# Patient Record
Sex: Female | Born: 1985 | Race: White | Hispanic: No | Smoking: Current every day smoker
Health system: Southern US, Community
[De-identification: ages and names within clinical notes are randomized; demographics above are authoritative.]

## PROBLEM LIST (undated history)

## (undated) DIAGNOSIS — N390 Urinary tract infection, site not specified: Secondary | ICD-10-CM

## (undated) DIAGNOSIS — B1920 Unspecified viral hepatitis C without hepatic coma: Secondary | ICD-10-CM

## (undated) HISTORY — DX: Unspecified viral hepatitis C without hepatic coma: B19.20

## (undated) HISTORY — PX: TUBAL LIGATION: SHX77

---

## 2007-02-20 ENCOUNTER — Inpatient Hospital Stay (HOSPITAL_COMMUNITY): Admission: RE | Admit: 2007-02-20 | Discharge: 2007-02-22 | Payer: Self-pay | Admitting: Obstetrics and Gynecology

## 2008-02-23 ENCOUNTER — Emergency Department (HOSPITAL_COMMUNITY): Admission: EM | Admit: 2008-02-23 | Discharge: 2008-02-23 | Payer: Self-pay | Admitting: Emergency Medicine

## 2008-06-02 ENCOUNTER — Emergency Department (HOSPITAL_COMMUNITY): Admission: EM | Admit: 2008-06-02 | Discharge: 2008-06-02 | Payer: Self-pay | Admitting: Emergency Medicine

## 2008-07-04 ENCOUNTER — Ambulatory Visit (HOSPITAL_COMMUNITY): Admission: RE | Admit: 2008-07-04 | Discharge: 2008-07-04 | Payer: Self-pay | Admitting: Obstetrics & Gynecology

## 2008-08-22 ENCOUNTER — Ambulatory Visit (HOSPITAL_COMMUNITY): Admission: RE | Admit: 2008-08-22 | Discharge: 2008-08-22 | Payer: Self-pay | Admitting: Obstetrics & Gynecology

## 2008-09-08 ENCOUNTER — Emergency Department (HOSPITAL_COMMUNITY): Admission: EM | Admit: 2008-09-08 | Discharge: 2008-09-08 | Payer: Self-pay | Admitting: Emergency Medicine

## 2008-09-09 ENCOUNTER — Other Ambulatory Visit: Admission: RE | Admit: 2008-09-09 | Discharge: 2008-09-09 | Payer: Self-pay | Admitting: Obstetrics and Gynecology

## 2008-09-25 ENCOUNTER — Ambulatory Visit (HOSPITAL_COMMUNITY): Admission: RE | Admit: 2008-09-25 | Discharge: 2008-09-25 | Payer: Self-pay | Admitting: Obstetrics & Gynecology

## 2008-11-28 ENCOUNTER — Inpatient Hospital Stay (HOSPITAL_COMMUNITY): Admission: RE | Admit: 2008-11-28 | Discharge: 2008-11-30 | Payer: Self-pay | Admitting: Obstetrics & Gynecology

## 2008-11-28 ENCOUNTER — Encounter: Payer: Self-pay | Admitting: Obstetrics & Gynecology

## 2008-12-07 ENCOUNTER — Emergency Department (HOSPITAL_COMMUNITY): Admission: EM | Admit: 2008-12-07 | Discharge: 2008-12-07 | Payer: Self-pay | Admitting: Emergency Medicine

## 2009-10-16 ENCOUNTER — Emergency Department (HOSPITAL_COMMUNITY): Admission: EM | Admit: 2009-10-16 | Discharge: 2009-10-16 | Payer: Self-pay | Admitting: Emergency Medicine

## 2009-11-27 ENCOUNTER — Emergency Department (HOSPITAL_COMMUNITY): Admission: EM | Admit: 2009-11-27 | Discharge: 2009-11-27 | Payer: Self-pay | Admitting: Emergency Medicine

## 2010-05-20 IMAGING — US US OB FOLLOW-UP
1 series · 18 of 28 positions shown · non-contrast
Comparison: none

OBSTETRICAL ULTRASOUND:
 This ultrasound was performed in The [HOSPITAL], and the [facility] report will be stored to [REDACTED] PACS.

[Series 1: us ob follow-up · 59 acquisitions, 18 frames shown]
[im 1/59]
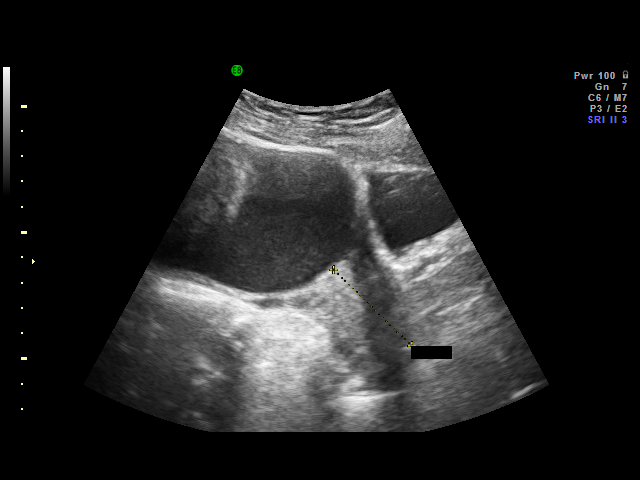
[im 5/59]
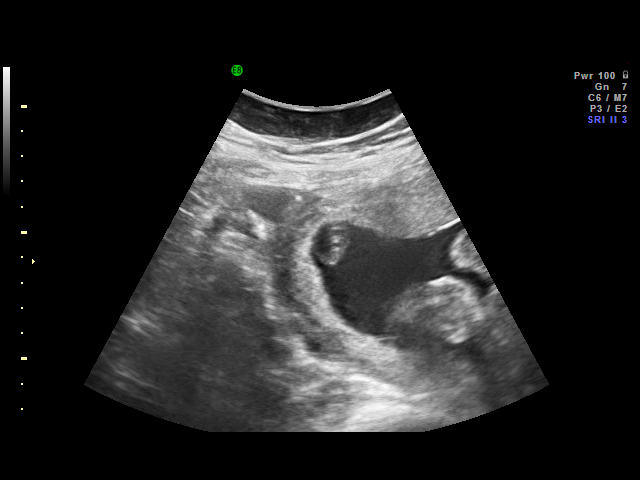
[im 7/59]
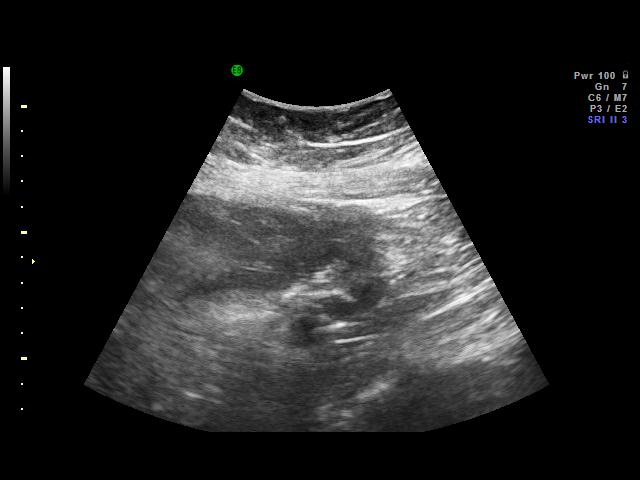
[im 11/59]
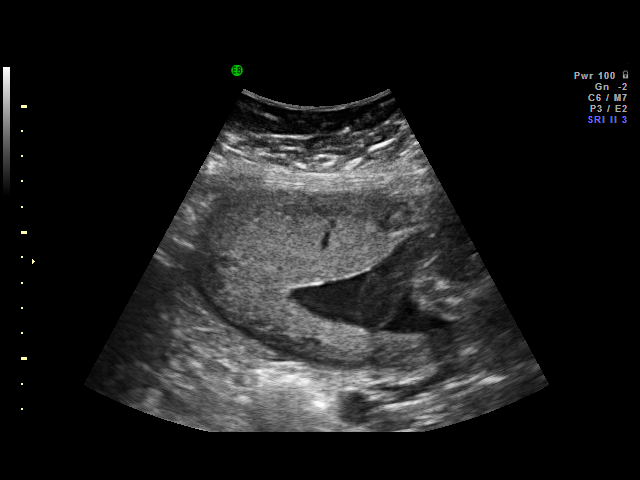
[im 16/59]
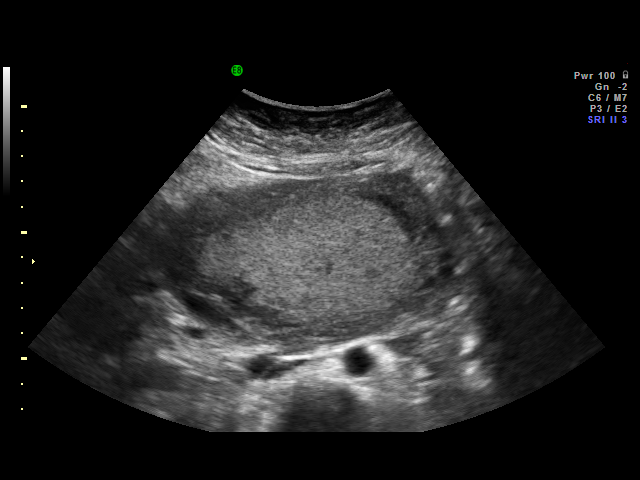
[im 18/59]
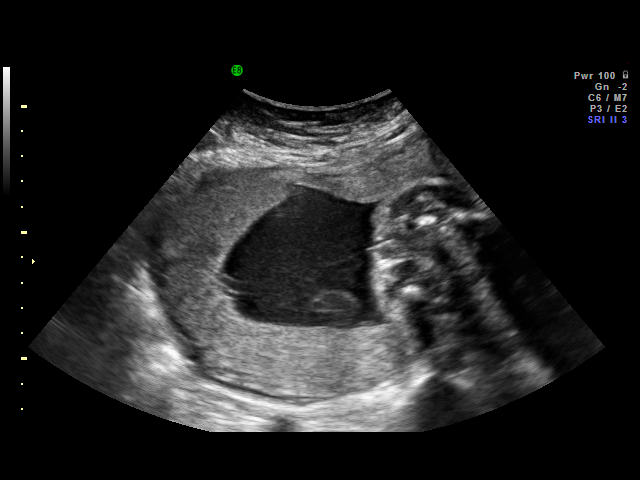
[im 22/59]
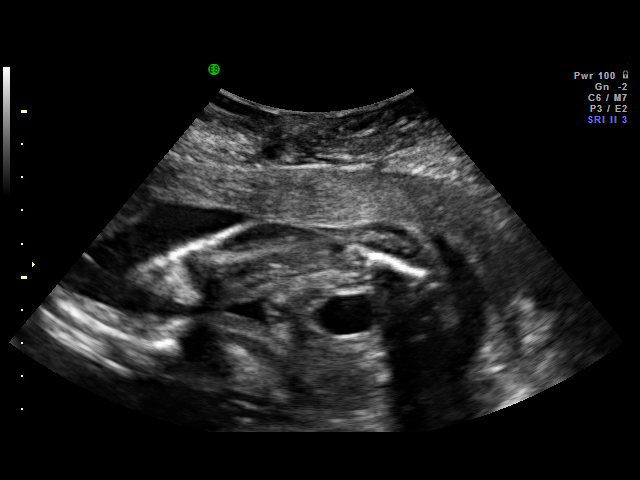
[im 24/59]
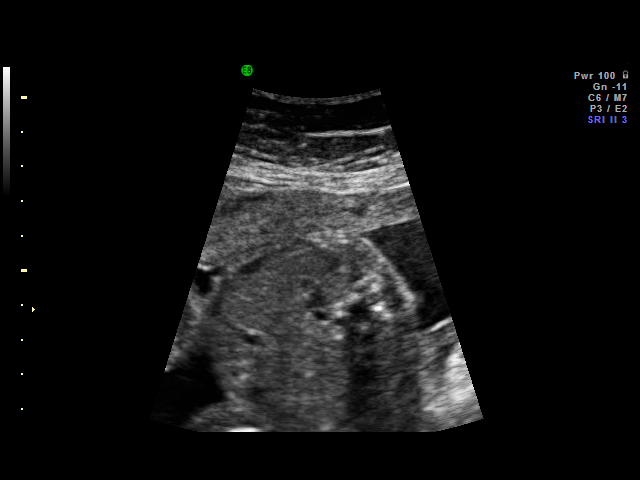
[im 28/59]
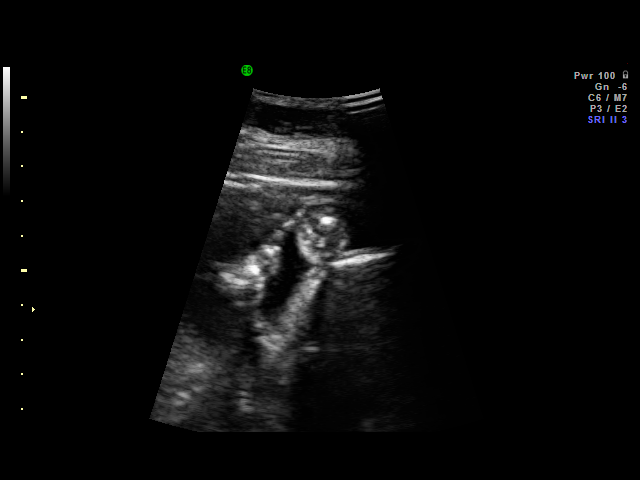
[im 31/59]
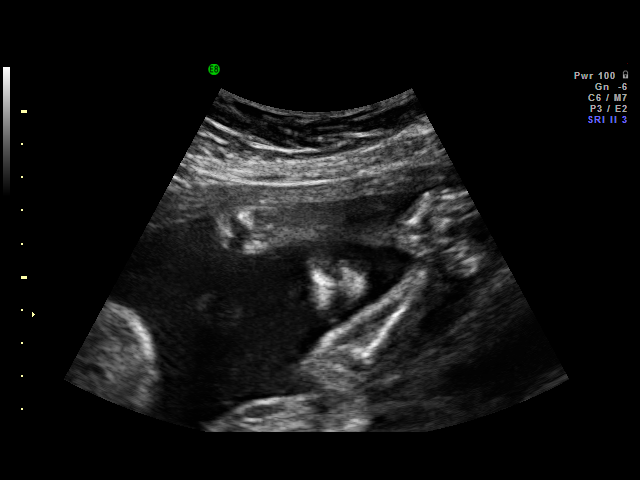
[im 35/59]
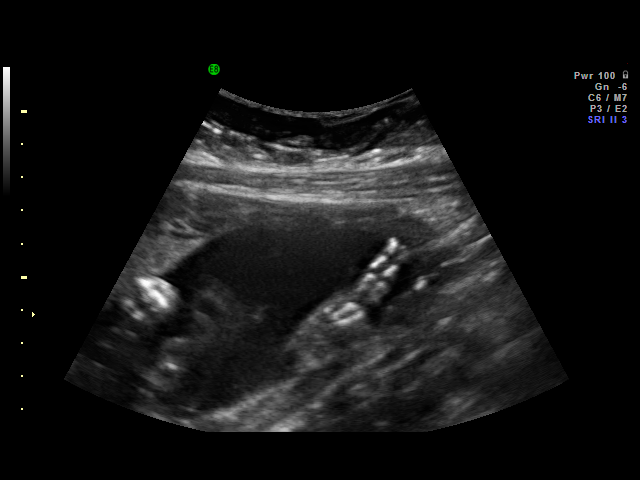
[im 37/59]
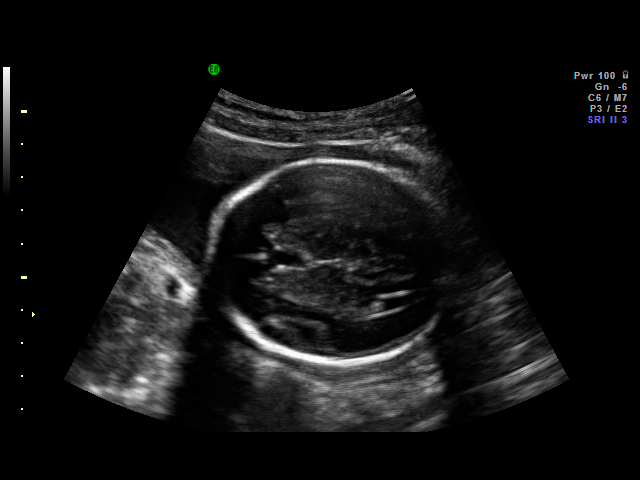
[im 41/59]
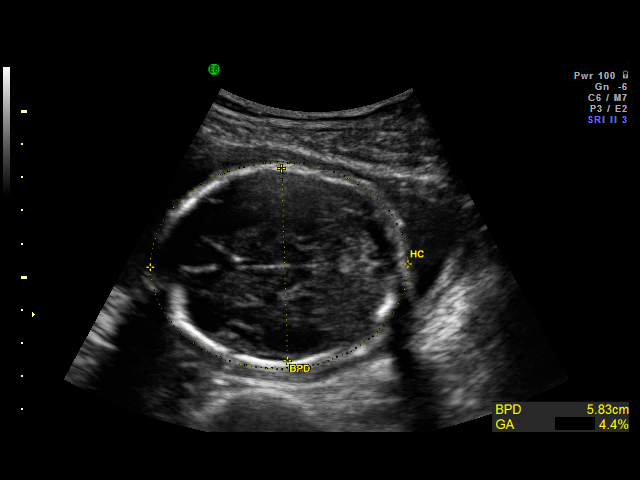
[im 46/59]
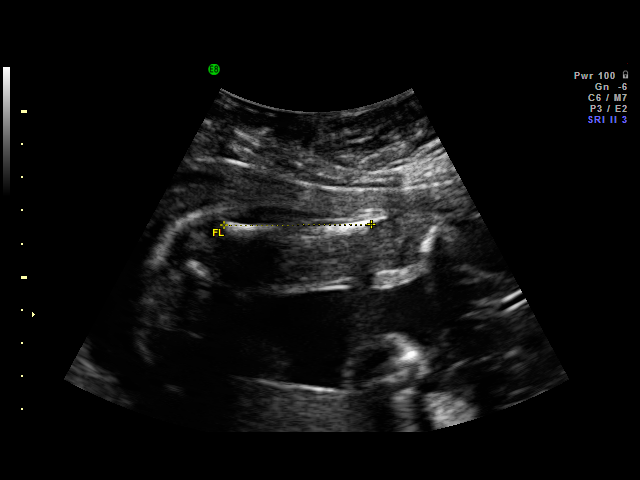
[im 48/59]
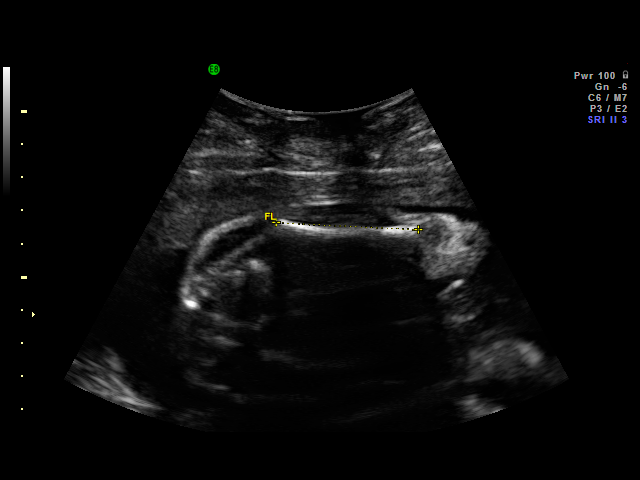
[im 52/59]
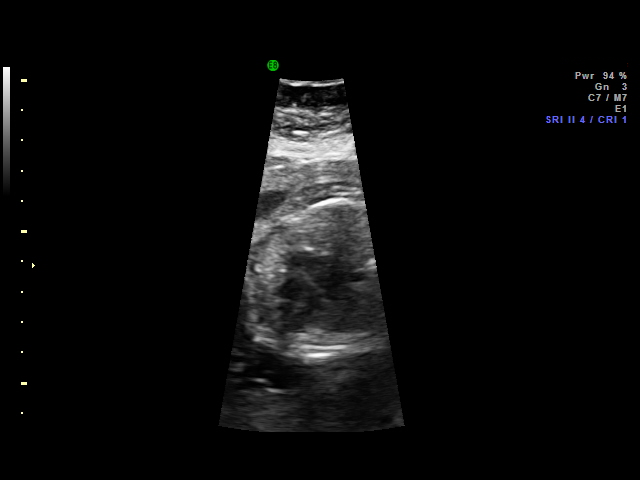
[im 54/59]
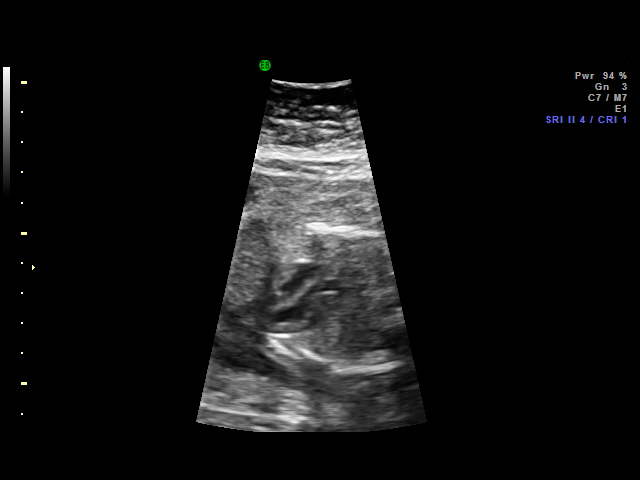
[im 59/59]
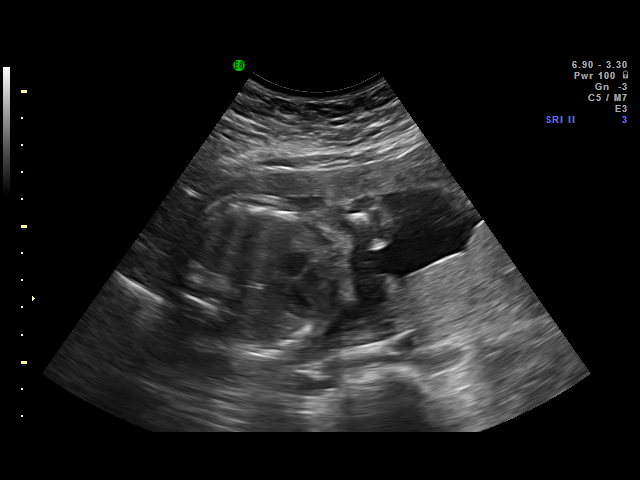

[18 of 28 positions shown; findings below may reference images not displayed]

IMPRESSION: [facility] has also been faxed to the ordering physician.

## 2010-07-28 ENCOUNTER — Emergency Department (HOSPITAL_COMMUNITY)
Admission: EM | Admit: 2010-07-28 | Discharge: 2010-07-28 | Payer: Self-pay | Source: Home / Self Care | Admitting: Emergency Medicine

## 2010-08-23 ENCOUNTER — Encounter: Payer: Self-pay | Admitting: Obstetrics & Gynecology

## 2010-10-20 LAB — URINE MICROSCOPIC-ADD ON

## 2010-10-20 LAB — URINALYSIS, ROUTINE W REFLEX MICROSCOPIC
Glucose, UA: NEGATIVE mg/dL
Ketones, ur: NEGATIVE mg/dL
Nitrite: POSITIVE — AB
Protein, ur: 30 mg/dL — AB
Specific Gravity, Urine: 1.015 (ref 1.005–1.030)
Urobilinogen, UA: 1 mg/dL (ref 0.0–1.0)
pH: 6 (ref 5.0–8.0)

## 2010-10-20 LAB — URINE CULTURE

## 2010-10-25 LAB — WET PREP, GENITAL: Yeast Wet Prep HPF POC: NONE SEEN

## 2010-10-25 LAB — URINALYSIS, ROUTINE W REFLEX MICROSCOPIC
Glucose, UA: NEGATIVE mg/dL
Ketones, ur: NEGATIVE mg/dL
Protein, ur: NEGATIVE mg/dL
Urobilinogen, UA: 0.2 mg/dL (ref 0.0–1.0)

## 2010-10-25 LAB — PREGNANCY, URINE: Preg Test, Ur: NEGATIVE

## 2010-11-10 LAB — PREGNANCY, URINE: Preg Test, Ur: POSITIVE

## 2010-11-10 LAB — URINALYSIS, ROUTINE W REFLEX MICROSCOPIC
Ketones, ur: NEGATIVE mg/dL
Nitrite: NEGATIVE
Protein, ur: NEGATIVE mg/dL
pH: 6.5 (ref 5.0–8.0)

## 2010-11-11 LAB — CBC
Hemoglobin: 11.1 g/dL — ABNORMAL LOW (ref 12.0–15.0)
MCHC: 33.6 g/dL (ref 30.0–36.0)
Platelets: 215 10*3/uL (ref 150–400)
RDW: 13.9 % (ref 11.5–15.5)
RDW: 13.9 % (ref 11.5–15.5)
WBC: 9.1 10*3/uL (ref 4.0–10.5)

## 2010-11-11 LAB — TYPE AND SCREEN
ABO/RH(D): B POS
Antibody Screen: NEGATIVE

## 2010-11-17 LAB — URINALYSIS, ROUTINE W REFLEX MICROSCOPIC
Nitrite: NEGATIVE
Protein, ur: NEGATIVE mg/dL
Urobilinogen, UA: 0.2 mg/dL (ref 0.0–1.0)

## 2010-11-17 LAB — POCT I-STAT, CHEM 8
HCT: 32 % — ABNORMAL LOW (ref 36.0–46.0)
Hemoglobin: 10.9 g/dL — ABNORMAL LOW (ref 12.0–15.0)
Sodium: 138 mEq/L (ref 135–145)
TCO2: 21 mmol/L (ref 0–100)

## 2010-12-15 NOTE — Op Note (Signed)
Diana [name], Diana [name]             ACCOUNT NO.:  [id number]   MEDICAL RECORD NO.:  [id number]          PATIENT TYPE:  INP   LOCATION:  9110                          FACILITY:  [facility]   PHYSICIAN:  Lazaro Arms, M.D.   DATE OF BIRTH:  07-31-1986   DATE OF PROCEDURE:  11/28/2008  DATE OF DISCHARGE:                               OPERATIVE REPORT   PREOPERATIVE DIAGNOSES:  1. Intrauterine pregnancy at 36 plus weeks' gestation.  2. Previous cesarean section x2.  3. Desires sterilization.   POSTOPERATIVE DIAGNOSES:  1. Intrauterine pregnancy at 54 plus weeks' gestation.  2. Previous cesarean section x2.  3. Desires sterilization.   PROCEDURE:  Repeat cesarean section and tubal ligation.   SURGEON:  Lazaro Arms, MD   ASSISTANT:  Dr. Noel Gerold.   ANESTHESIA:  Spinal.   FINDINGS:  Over low transverse hysterotomy incision, was delivered a  viable female at 79 with Apgars of 8 and 8, weight 6 pounds 12 ounces,  cord pH of 7.29.  Uterus, tubes, and ovaries were all normal.   DESCRIPTION OF OPERATION:  The patient was taken to the operating room,  placed in sitting position where she underwent a spinal anesthetic,  placed in supine position, rolled to her left, prepped and draped in  usual sterile fashion.  A Pfannenstiel skin incision was made and  carried down sharply to the rectus fascia, scored in the midline and  extended laterally.  The fascia was taken off the muscle superiorly and  inferiorly without difficulty.  The muscles were divided and the  peritoneal cavity was entered.  A bladder blade was placed.  The  vesicouterine serosal flap was created.  Low transverse hysterotomy  incision was made over this incision and delivered a viable female at  [phone] with Apgars of 8 and 8, weighing 6 pounds 12 ounces, three-vessel  cord, cord blood and cord gases were sent.  Cord gas 7.29.  Placenta was  delivered spontaneously.  The uterus was closed in single layer,  interlocking layer.   Modified Pomeroy bilateral tubal ligation was  performed.  A 2.5-cm segment bilaterally was removed and sent to  pathology.  There was good hemostasis.  The uterus was placed at  peritoneal cavity.  The pelvis was irrigated.  All pedicles found to be  hemostatic.  The peritoneum was reapproximated loosely.  The fascia  closed using 0 Vicryl  running.  Subcutaneous tissues were made hemostatic and irrigated.  The  skin was closed using skin staples.  The patient tolerated the procedure  well.  She experienced 500 mL of blood loss and taken to the recovery  room in good and stable condition.  All counts correct x3.      Lazaro Arms, M.D.  Electronically Signed     LHE/MEDQ  D:  11/28/2008  T:  11/29/2008  Job:  [id number]

## 2010-12-15 NOTE — Discharge Summary (Signed)
Diana, [name]             ACCOUNT NO.:  [id number]   MEDICAL RECORD NO.:  [id number]          PATIENT TYPE:  INP   LOCATION:  9130                          [facility]ACILITY:  [facility]   PHYSICIAN:  Tilda Burrow, M.D. DATE OF BIRTH:  July 13, 1986   DATE OF ADMISSION:  02/20/2007  DATE OF DISCHARGE:  07/23/2008LH                               DISCHARGE SUMMARY   ADMITTING DIAGNOSIS:  Pregnancy, 39 weeks, repeat cesarean section, not  for trial of labor.   DISCHARGE DIAGNOSES:  1. Pregnancy, 39 weeks, delivered, repeat cesarean section.  2. Uterine adhesions to abdominal wall.   PROCEDURE:  Repeat low transverse cervical cesarean section, lysis of  uterine adhesions to abdominal wall, Jannifer Franklin, February 20, 2007.   DISCHARGE MEDICATIONS:  1. Vicodin 5/500 #30 tablets one to two q. 4 hours p.r.n. pain.  2. Motrin 800 mg #30 tablets one p.o. q. 8 hours p.r.n. cramping.  3. Prenatal vitamin one daily.   FOLLOWUP:  Five to seven days for staple removal.   HOSPITAL SUMMARY:  This 25 year old gravida 2, para 1 was admitted for  repeat cesarean section.  See HPI, for details.  Prenatal course was  uneventful.  She had a straightforward cesarean section, except for the  fact that there were significant uterine adhesions to the anterior  abdominal wall, which were transected as a portion of the procedure.  Postoperatively, the patient did excellently, was ambulatory, active,  had blood type B-positive confirmed, had hemoglobin 9, hematocrit 28,  was tolerating a regular diet within 24 hours and discharged at 48 hours  with staple removal within a week and followup in four weeks.   Implanon for contraception is planned in the future and Depo-Provera is  administered at the time of discharge to allow for interval  consideration of the Implanon.  She is to receive Implanon instruction  booklet when she comes to the office for staple removal followup within  a week.      Tilda Burrow, M.D.  Electronically Signed    JVF/MEDQ  D:  02/22/2007  T:  02/22/2007  Job:  [id number]   cc:   [facility], [place]

## 2010-12-15 NOTE — H&P (Signed)
Diana [name], Diana [name]             ACCOUNT NO.:  [id number]   MEDICAL RECORD NO.:  [id number]          PATIENT TYPE:  INP   LOCATION:                                [facility]  [facility]   PHYSICIAN:  Tilda Burrow, M.D. DATE OF BIRTH:  [birth year]   DATE OF ADMISSION:  02/20/2007  DATE OF DISCHARGE:  02/22/2007                              HISTORY & PHYSICAL   DATE/TIME OF ADMISSION:  8:45 a.m. February 20, 2007.   ADMISSION DIAGNOSES:  1. Pregnancy 39 weeks' gestation.  2. Prior cesarean section, not for trial of labor.   HISTORY OF PRESENT ILLNESS:  This 25 year old female is gravida 1, para  0 with prior cesarean section at [facility] in 2002 for failure  to progress and fetal distress, according to patient history, is  admitted for repeat cesarean section.  Diana [name] was followed through  our office through 9 prenatal visits with appropriate weight gain of 11  pounds.  Fundal height growth to 37 cm.  Prenatal labs include blood  type B positive, rubella immunity present, urine drug screen positive  for THC initially.  Additional blood tests include rubella immunity  present, hemoglobin 11, hematocrit 33, hepatitis, HIV, RPR, Gc and  chlamydia are all negative.  Pap smear showed Trichomonas and it was  rechecked and found at 31 weeks and treated again.  Glucose tolerance  test 110 mg/percent.  She plans to bottle-feed, Depo-Provera is  contraception planned and she will take the baby to Dr. Lysbeth Galas in  [place].   PAST MEDICAL HISTORY:  Benign.   PAST SURGICAL HISTORY:  Cesarean section times 1.   ALLERGIES:  There are no known drug allergies.   SOCIAL HISTORY:  She works at [employer], single, lives with mother.   PHYSICAL EXAMINATION:  HEENT:  Pupils are equal, round, reactive.  Extraocular movement is intact.  NECK:  Supple.  Trachea is midline.  ABDOMEN:  Fundal height is 37 cm.  Estimated fetal weight 7-1/2 pounds.  Abdomen also shows a very flat symphysis pubis.  She  is not considered a  likely candidate for VBAC.   PLAN:  Repeat cesarean section February 20, 2007.      Tilda Burrow, M.D.  Electronically Signed     JVF/MEDQ  D:  02/14/2007  T:  02/15/2007  Job:  [id number]   cc:   Delaney Meigs, M.D.  Fax: [phone]

## 2010-12-15 NOTE — Discharge Summary (Signed)
NAMEARTHURINE, [name]             ACCOUNT NO.:  [id number]   MEDICAL RECORD NO.:  [id number]         PATIENT TYPE:  WINP   LOCATION:                                [facility]   PHYSICIAN:  Lazaro Arms, M.D.   DATE OF BIRTH:  July 25, 1986   DATE OF ADMISSION:  11/28/2008  DATE OF DISCHARGE:                               DISCHARGE SUMMARY   REASON FOR HOSPITALIZATION:  Repeat C-section and bilateral tubal  ligation.   PERTINENT LABORATORY DATA:  Admit hemoglobin 11.1 preop, postop 9.8.  RPR nonreactive.   FINAL DIAGNOSES:  1. 39 weeks' intrauterine pregnancy.  2. Repeat cesarean section.  3. Bilateral tubal ligation.   SIGNIFICANT FINDINGS:  Delivery of a female infant, 6 pounds 12 ounces,  Apgars 8 and 8.  EBL 500 mL.  During the course of postpartum, it was  unremarkable.   PROCEDURES:  Bilateral tubal ligation and repeat cesarean section.   CONDITION OF THE PATIENT ON DISCHARGE:  The patient is stable,  ambulating without difficulty, voiding, decreased bleeding, vital signs  stable, and bottle feeding.   DISCHARGE INSTRUCTIONS:  The patient is discharged to home.  Prescriptions given for Percocet 1-2 q.4-6 h. for pain #40, Integra iron  pills, and Colace, stool softener.  The patient is to follow up on  Tuesday at [facility] for staple removal.  The patient given postpartum  precautions to return if increased bleeding, signs of infection, or any  questions or concerns, regular diet, and do not lift greater than 10  pounds.      Sid Falcon, CNM      Lazaro Arms, M.D.  Electronically Signed    WM/MEDQ  D:  11/30/2008  T:  12/01/2008  Job:  [id number]

## 2010-12-15 NOTE — Op Note (Signed)
Diana [name], Diana [name]             ACCOUNT NO.:  [id number]   MEDICAL RECORD NO.:  [id number]          PATIENT TYPE:  INP   LOCATION:  9199                          [facility]   PHYSICIAN:  Tilda Burrow, M.D. DATE OF BIRTH:  Feb 28, 1986   DATE OF PROCEDURE:  02/20/2007  DATE OF DISCHARGE:                               OPERATIVE REPORT   PREOPERATIVE DIAGNOSES:  1. Pregnancy 39 weeks.  2. Repeat cesarean section.  3. Not for trial of labor.   POSTOPERATIVE DIAGNOSES:  1. Pregnancy 39 weeks.  2. Repeat cesarean section.  3. Not for trial of labor.  4. Significant adhesions uterus to anterior abdominal wall.   PROCEDURE:  1. Repeat low transverse cervical cesarean section.  2. Lysis of uterine adhesions to anterior abdominal wall.   SURGEON:  Ferguson.   ASSISTANT:  None.   ANESTHESIA:  Spinal.   COMPLICATIONS:  None.   FINDINGS:  1. Dense adhesions from anterior uterine fundus to anterior abdominal      wall necessitating lysis and efforts at reperitonealization.  2. A healthy female at 9:13 a.m., Apgars 8 and 9, weight __________.   INDICATIONS:  A 25 year old female gravida 2, para 1, undergoing repeat  C-section.   DETAILS OF THE PROCEDURE:  The patient was taken to the operating room,  prepped and draped after spinal anesthesia satisfactorily introduced.  Foley catheter was in place.  A transverse lower abdominal incision was  repeated with excision of a small portion of skin and cicatrix from the  prior cesarean section.  The Pfannenstiel technique was used to open the  peritoneal cavity.  The bladder flap was adherent to the anterior  abdominal wall fairly extensively but we were able to, with some  difficulty, establish adequate access to the lower uterine segment.  A  transverse uterine incision was made, clear amniotic fluid encountered,  fetal vertex guided into the incision and fundal pressure applied  delivering the baby, and the shoulders used for  traction as fundal  pressure was applied for expulsion of the baby.  Cord was clamped and  the infant passed to the waiting pediatrician, Dr. Joana Reamer.  Apgars 8  and 9 were assigned, weight is pending.   The cord was quite small, placenta had a central cord insertion.  Placenta was without meconium discoloration or malodor so it was  discarded.  The uterus was irrigated with saline solution, inspected for  membrane remnants and some membrane remnants in the lower uterine  segment were extracted.  The uterus was closed in a single layer running  locking closure of 0 Chromic with two figure-of-eight sutures necessary  on the upper portion of the incision to establish adequate hemostasis.   The adhesions were addressed at this time.   ADHESIOLYSIS involved reestablishing normal anatomy.  The adhesions went  all the way up to the round ligament on the left side and required sharp  dissection of thin filmy adhesions until the dense adhesions could be  isolated and these were primarily myometrium attached to the anterior  abdominal wall.  These were transected with Bovie cautery and then  the  pedicles ligated with a horizontal mattress suture to achieve adequate  hemostasis.  A couple of #2-0 Chromics were necessary to reperitonealize  the anterior wall as much as possible.  At this time the uterus was  significantly more mobile and then we went down and reinspected the  incision on the uterus, then loosely reapproximated the bladder flap  using #2-0 chromic.  The anterior peritoneum was then grasped through  its edges and reperitonealized using #2-0 Chromic.  The fascia was then  reapproximated using running #0 Vicryl.  The subcu fatty tissues were  mobilized on the inferior margin sufficiently to allow a smoother skin  surface and interrupted #2-0 plain suture used for subcu  reapproximation.  Staple closure of the skin completed the procedure  with the patient having uterine fundus firm  at U+1 at the end of the  procedure.  Sponge and needle counts correct.  EBL 600 mL.      Tilda Burrow, M.D.  Electronically Signed     JVF/MEDQ  D:  02/20/2007  T:  02/20/2007  Job:  [id number]   cc:   Delaney Meigs, M.D.  Fax: [phone]

## 2011-05-04 LAB — URINALYSIS, ROUTINE W REFLEX MICROSCOPIC
Hgb urine dipstick: NEGATIVE
Protein, ur: NEGATIVE
Urobilinogen, UA: 0.2

## 2011-05-04 LAB — PREGNANCY, URINE: Preg Test, Ur: POSITIVE

## 2011-05-05 ENCOUNTER — Encounter: Payer: Self-pay | Admitting: *Deleted

## 2011-05-05 ENCOUNTER — Emergency Department (HOSPITAL_COMMUNITY)
Admission: EM | Admit: 2011-05-05 | Discharge: 2011-05-05 | Disposition: A | Discharge status: Home / Self Care | Payer: Self-pay | Attending: Emergency Medicine | Admitting: Emergency Medicine

## 2011-05-05 DIAGNOSIS — F17200 Nicotine dependence, unspecified, uncomplicated: Secondary | ICD-10-CM | POA: Insufficient documentation

## 2011-05-05 DIAGNOSIS — L0390 Cellulitis, unspecified: Secondary | ICD-10-CM

## 2011-05-05 DIAGNOSIS — R509 Fever, unspecified: Secondary | ICD-10-CM | POA: Insufficient documentation

## 2011-05-05 DIAGNOSIS — M79609 Pain in unspecified limb: Secondary | ICD-10-CM | POA: Insufficient documentation

## 2011-05-05 DIAGNOSIS — R000 Tachycardia, unspecified: Secondary | ICD-10-CM | POA: Insufficient documentation

## 2011-05-05 DIAGNOSIS — M7989 Other specified soft tissue disorders: Secondary | ICD-10-CM | POA: Insufficient documentation

## 2011-05-05 DIAGNOSIS — L0291 Cutaneous abscess, unspecified: Secondary | ICD-10-CM | POA: Insufficient documentation

## 2011-05-05 MED ORDER — OXYCODONE-ACETAMINOPHEN 5-325 MG PO TABS: 1.0000 | ORAL_TABLET | Freq: Four times a day (QID) | ORAL | Status: AC | PRN

## 2011-05-05 MED ORDER — LIDOCAINE HCL (PF) 1 % IJ SOLN
INTRAMUSCULAR | Status: AC
  Administered 2011-05-05: 5 mL
  Filled 2011-05-05: qty 5

## 2011-05-05 MED ORDER — CEFTRIAXONE SODIUM 1 G IJ SOLR
1.0000 g | Freq: Once | INTRAMUSCULAR | Status: AC
  Administered 2011-05-05: 1 g via INTRAMUSCULAR
  Filled 2011-05-05: qty 1

## 2011-05-05 MED ORDER — SULFAMETHOXAZOLE-TRIMETHOPRIM 800-160 MG PO TABS: 1.0000 | ORAL_TABLET | Freq: Two times a day (BID) | ORAL | Status: AC

## 2011-05-05 MED ORDER — ONDANSETRON HCL 4 MG PO TABS
4.0000 mg | ORAL_TABLET | Freq: Once | ORAL | Status: AC
  Administered 2011-05-05: 4 mg via ORAL
  Filled 2011-05-05: qty 1

## 2011-05-05 MED ORDER — SULFAMETHOXAZOLE-TMP DS 800-160 MG PO TABS
1.0000 | ORAL_TABLET | Freq: Once | ORAL | Status: AC
  Administered 2011-05-05: 1 via ORAL
  Filled 2011-05-05: qty 1

## 2011-05-05 MED ORDER — IBUPROFEN 800 MG PO TABS
800.0000 mg | ORAL_TABLET | Freq: Once | ORAL | Status: AC
  Administered 2011-05-05: 800 mg via ORAL
  Filled 2011-05-05: qty 1

## 2011-05-05 MED ORDER — OXYCODONE-ACETAMINOPHEN 5-325 MG PO TABS
1.0000 | ORAL_TABLET | Freq: Once | ORAL | Status: AC
  Administered 2011-05-05: 1 via ORAL
  Filled 2011-05-05: qty 1

## 2011-05-05 NOTE — ED Provider Notes (Signed)
History     CSN: [id number] Arrival date & time: 05/05/2011  9:06 PM  Chief Complaint  Patient presents with  . Foot Swelling  . Foot Pain    (Consider location/radiation/quality/duration/timing/severity/associated sxs/prior treatment) Patient is a 25 y.o. female presenting with lower extremity pain. The history is provided by the patient.  Foot Pain This is a new problem. The current episode started in the past 7 days. The problem occurs daily. The problem has been gradually worsening. Associated symptoms include chills and a fever. Pertinent negatives include no abdominal pain, arthralgias, chest pain, coughing or neck pain. The symptoms are aggravated by standing and walking. She has tried NSAIDs for the symptoms. The treatment provided no relief.    History reviewed. No pertinent past medical history.  Past Surgical History  Procedure Date  . Cesarean section     History reviewed. No pertinent family history.  History  Substance Use Topics  . Smoking status: Current Everyday Smoker -- 1.0 packs/day    Types: Cigarettes  . Smokeless tobacco: Not on file  . Alcohol Use: No    OB History    Grav Para Term Preterm Abortions TAB SAB Ect Mult Living                  Review of Systems  Constitutional: Positive for fever and chills. Negative for activity change.       All ROS Neg except as noted in HPI  HENT: Negative for nosebleeds and neck pain.   Eyes: Negative for photophobia and discharge.  Respiratory: Negative for cough, shortness of breath and wheezing.   Cardiovascular: Negative for chest pain and palpitations.  Gastrointestinal: Negative for abdominal pain and blood in stool.  Genitourinary: Negative for dysuria, frequency and hematuria.  Musculoskeletal: Negative for back pain and arthralgias.  Skin: Negative.   Neurological: Negative for dizziness, seizures and speech difficulty.  Psychiatric/Behavioral: Negative for hallucinations and confusion.     Allergies  Review of patient's allergies indicates no known allergies.  Home Medications   Current Outpatient Rx  Name Route Sig Dispense Refill  . GOODY HEADACHE PO Oral Take 1 packet by mouth once as needed. For pain     . IBUPROFEN 200 MG PO TABS Oral Take 400 mg by mouth 3 (three) times daily as needed. For pain     . OXYCODONE-ACETAMINOPHEN 5-325 MG PO TABS Oral Take 1 tablet by mouth every 6 (six) hours as needed for pain. 20 tablet 0  . SULFAMETHOXAZOLE-TRIMETHOPRIM 800-160 MG PO TABS Oral Take 1 tablet by mouth 2 (two) times daily. 14 tablet 0    BP 109/48  Pulse 105  Temp(Src) 99.9 F (37.7 C) (Oral)  Resp 16  Ht 5\' 1"  (1.549 m)  Wt 160 lb (72.576 kg)  BMI 30.23 kg/m2  SpO2 100%  LMP 04/05/2011  Physical Exam  Nursing note and vitals reviewed. Constitutional: She is oriented to person, place, and time. She appears well-developed and well-nourished.  Non-toxic appearance.  HENT:  Head: Normocephalic.  Right Ear: Tympanic membrane and external ear normal.  Left Ear: Tympanic membrane and external ear normal.  Eyes: EOM and lids are normal. Pupils are equal, round, and reactive to light.  Neck: Normal range of motion. Neck supple. Carotid bruit is not present.  Cardiovascular: Regular rhythm, normal heart sounds, intact distal pulses and normal pulses.  Tachycardia present.   Pulmonary/Chest: Breath sounds normal. No respiratory distress.  Abdominal: Soft. Bowel sounds are normal. There is no tenderness. There is  no guarding.  Musculoskeletal: Normal range of motion.       There is increased redness and swelling of the dorsum of the right foot extending to the ankle. The area is painful and warm to touch. There is a tiny black scab in the middle of the red swollen area.  Lymphadenopathy:       Head (right side): No submandibular adenopathy present.       Head (left side): No submandibular adenopathy present.    She has no cervical adenopathy.  Neurological: She  is alert and oriented to person, place, and time. She has normal strength. No cranial nerve deficit or sensory deficit.  Skin: Skin is warm and dry.  Psychiatric: She has a normal mood and affect. Her speech is normal.    ED Course: Pt to return on 10/6 for recheck in the ED. Rx for bactrim and percocet given with instructions for elevation.  Procedures (including critical care time)  Labs Reviewed - No data to display No results found.   1. Cellulitis       MDM  I have reviewed nursing notes, vital signs, and all appropriate lab and imaging results for this patient.        Kathie Dike, [place] 05/05/11 2220

## 2011-05-05 NOTE — ED Provider Notes (Signed)
Medical screening examination/treatment/procedure(s) were performed by non-physician practitioner and as supervising physician I was immediately available for consultation/collaboration.   Desmund Elman L Sonnet Rizor, MD 05/05/11 2356 

## 2011-05-05 NOTE — ED Notes (Signed)
Patient with swelling and redness with pain to right foot; patient states that it happened Sat. night

## 2011-05-08 ENCOUNTER — Emergency Department (HOSPITAL_COMMUNITY)
Admission: EM | Admit: 2011-05-08 | Discharge: 2011-05-08 | Disposition: A | Discharge status: Home / Self Care | Payer: Self-pay | Attending: Emergency Medicine | Admitting: Emergency Medicine

## 2011-05-08 ENCOUNTER — Encounter (HOSPITAL_COMMUNITY): Payer: Self-pay | Admitting: Emergency Medicine

## 2011-05-08 DIAGNOSIS — L0390 Cellulitis, unspecified: Secondary | ICD-10-CM

## 2011-05-08 DIAGNOSIS — L02611 Cutaneous abscess of right foot: Secondary | ICD-10-CM

## 2011-05-08 DIAGNOSIS — F17200 Nicotine dependence, unspecified, uncomplicated: Secondary | ICD-10-CM | POA: Insufficient documentation

## 2011-05-08 DIAGNOSIS — L02619 Cutaneous abscess of unspecified foot: Secondary | ICD-10-CM | POA: Insufficient documentation

## 2011-05-08 MED ORDER — IBUPROFEN 800 MG PO TABS: 800.0000 mg | ORAL_TABLET | Freq: Three times a day (TID) | ORAL | Status: AC

## 2011-05-08 MED ORDER — CEPHALEXIN 500 MG PO CAPS: 1000.0000 mg | ORAL_CAPSULE | Freq: Two times a day (BID) | ORAL | Status: AC

## 2011-05-08 MED ORDER — DEXTROSE 5 % IV SOLN
1.0000 g | INTRAVENOUS | Status: DC
  Administered 2011-05-08: 1 g via INTRAVENOUS
  Filled 2011-05-08: qty 1

## 2011-05-08 MED ORDER — MORPHINE SULFATE 4 MG/ML IJ SOLN
4.0000 mg | Freq: Once | INTRAMUSCULAR | Status: AC
  Administered 2011-05-08: 4 mg via INTRAVENOUS
  Filled 2011-05-08: qty 1

## 2011-05-08 MED ORDER — LIDOCAINE-EPINEPHRINE 2 %-1:100000 IJ SOLN
20.0000 mL | Freq: Once | INTRAMUSCULAR | Status: AC
  Administered 2011-05-08: 20 mL

## 2011-05-08 MED ORDER — OXYCODONE-ACETAMINOPHEN 5-325 MG PO TABS: 1.0000 | ORAL_TABLET | ORAL | Status: AC | PRN

## 2011-05-08 MED ORDER — LIDOCAINE-EPINEPHRINE (PF) 2 %-1:200000 IJ SOLN
INTRAMUSCULAR | Status: AC
  Administered 2011-05-08: 13:00:00
  Filled 2011-05-08: qty 20

## 2011-05-08 MED ORDER — ONDANSETRON HCL 4 MG/2ML IJ SOLN
4.0000 mg | Freq: Once | INTRAMUSCULAR | Status: AC
  Administered 2011-05-08: 4 mg via INTRAVENOUS
  Filled 2011-05-08: qty 2

## 2011-05-08 NOTE — ED Notes (Signed)
Pt is here to have the cellulitis in her rt foot rechecked.

## 2011-05-08 NOTE — ED Notes (Signed)
Pt a/ox4. Resp even and unlabored. NAD at this time. D/C instructions and Rx x2 reviewed with pt. Pt verbalized understanding. Pt to POV via w/c. Pt husband to transport home.

## 2011-05-08 NOTE — ED Provider Notes (Signed)
History     CSN: [id number] Arrival date & time: 05/08/2011  9:10 AM  Chief Complaint  Patient presents with  . Wound Check    (Consider location/radiation/quality/duration/timing/severity/associated sxs/prior treatment) Patient is a 25 y.o. female presenting with wound check. The history is provided by the patient.  Wound Check  She was treated in the ED 2 to 3 days ago. Previous treatment in the ED includes oral antibiotics and IV/IM antibiotics. Treatments since wound repair include oral antibiotics and soaks. There has been no drainage from the wound. The redness has worsened. The swelling has worsened. The pain has worsened. There is difficulty moving the extremity or digit due to pain.    History reviewed. No pertinent past medical history.  Past Surgical History  Procedure Date  . Cesarean section     History reviewed. No pertinent family history.  History  Substance Use Topics  . Smoking status: Current Everyday Smoker -- 1.0 packs/day    Types: Cigarettes  . Smokeless tobacco: Not on file  . Alcohol Use: No    OB History    Grav Para Term Preterm Abortions TAB SAB Ect Mult Living                  Review of Systems  Constitutional: Negative for fever.  HENT: Negative for congestion, sore throat and neck pain.   Eyes: Negative.   Respiratory: Negative for chest tightness and shortness of breath.   Cardiovascular: Negative for chest pain.  Gastrointestinal: Negative for nausea and abdominal pain.  Genitourinary: Negative.   Musculoskeletal: Negative for joint swelling and arthralgias.  Skin: Positive for color change. Negative for rash and wound.  Neurological: Negative for dizziness, weakness, light-headedness, numbness and headaches.  Hematological: Negative.   Psychiatric/Behavioral: Negative.     Allergies  Review of patient's allergies indicates no known allergies.  Home Medications   Current Outpatient Rx  Name Route Sig Dispense Refill  .  GOODY HEADACHE PO Oral Take 1 packet by mouth once as needed. For pain    . IBUPROFEN 200 MG PO TABS Oral Take 400 mg by mouth 3 (three) times daily as needed. For pain     . OXYCODONE-ACETAMINOPHEN 5-325 MG PO TABS Oral Take 1 tablet by mouth every 6 (six) hours as needed for pain. 20 tablet 0  . SULFAMETHOXAZOLE-TRIMETHOPRIM 800-160 MG PO TABS Oral Take 1 tablet by mouth 2 (two) times daily. 14 tablet 0  . CEPHALEXIN 500 MG PO CAPS Oral Take 2 capsules (1,000 mg total) by mouth 2 (two) times daily. 28 capsule 0  . IBUPROFEN 800 MG PO TABS Oral Take 1 tablet (800 mg total) by mouth 3 (three) times daily. 21 tablet 0  . OXYCODONE-ACETAMINOPHEN 5-325 MG PO TABS Oral Take 1 tablet by mouth every 4 (four) hours as needed for pain. 15 tablet 0    BP 112/73  Pulse 80  Temp(Src) 98.9 F (37.2 C) (Oral)  Resp 20  Ht 5\' 1"  (1.549 m)  Wt 160 lb (72.576 kg)  BMI 30.23 kg/m2  SpO2 100%  LMP 04/05/2011  Physical Exam  Nursing note and vitals reviewed. Constitutional: She is oriented to person, place, and time. She appears well-developed and well-nourished.  HENT:  Head: Normocephalic and atraumatic.  Eyes: Conjunctivae are normal.  Neck: Normal range of motion.  Cardiovascular: Normal rate, regular rhythm, normal heart sounds and intact distal pulses.   Pulmonary/Chest: Effort normal and breath sounds normal. She has no wheezes.  Abdominal: Soft. Bowel sounds  are normal. There is no tenderness.  Musculoskeletal: Normal range of motion.  Neurological: She is alert and oriented to person, place, and time.  Skin: Skin is warm and dry. There is erythema.       Erythema and induration to right foot with modest edema of entire foot dorsum.  Fluctuance noted over midfoot.  Erythema does extend  Several cm beyond skin marker pen from 3 days ago,  Extends to distal anterior ankle.  (Patient reports redness and swelling was higher up her leg yesterday,  But actually better today,  But more painful and  swollen now at foot itself).  Pedal pulses intact.  Psychiatric: She has a normal mood and affect.    ED Course  INCISION AND DRAINAGE Performed by: Samer Dutton L Authorized by: Burgess Amor L Consent: Verbal consent obtained. Risks and benefits: risks, benefits and alternatives were discussed Consent given by: patient Patient understanding: patient states understanding of the procedure being performed Required items: required blood products, implants, devices, and special equipment available Patient identity confirmed: verbally with patient Time out: Immediately prior to procedure a "time out" was called to verify the correct patient, procedure, equipment, support staff and site/side marked as required. Type: abscess Body area: lower extremity Location details: right foot Anesthesia: local infiltration Local anesthetic: lidocaine 2% with epinephrine Anesthetic total: 3 ml Scalpel size: 11 Needle gauge: 25. Incision type: single straight Complexity: simple Drainage: purulent Drainage amount: moderate Packing material: 1/4 in iodoform gauze Patient tolerance: Patient tolerated the procedure well with no immediate complications.   (including critical care time)  Labs Reviewed - No data to display No results found.   1. Abscess of right foot   2. Cellulitis       MDM  Patient advised to continue bactrim.  Added keflex to regimen.  Planned f/u tomorrow for recheck .        Candis Musa, PA 05/08/11 [phone]

## 2011-05-08 NOTE — ED Notes (Signed)
Pt reports infection to top of right foot since Wednesday.  Says was given an antibiotic injection and po antibiotics but is no better.  Area of redness was traced and redness has spread past the ink mark.  Foot red and swollen.  Pedal pulse present.

## 2011-05-09 ENCOUNTER — Encounter (HOSPITAL_COMMUNITY): Payer: Self-pay | Admitting: Emergency Medicine

## 2011-05-09 ENCOUNTER — Emergency Department (HOSPITAL_COMMUNITY)
Admission: EM | Admit: 2011-05-09 | Discharge: 2011-05-09 | Disposition: A | Discharge status: Home / Self Care | Payer: Self-pay | Attending: Emergency Medicine | Admitting: Emergency Medicine

## 2011-05-09 DIAGNOSIS — F17200 Nicotine dependence, unspecified, uncomplicated: Secondary | ICD-10-CM | POA: Insufficient documentation

## 2011-05-09 DIAGNOSIS — Z5189 Encounter for other specified aftercare: Secondary | ICD-10-CM | POA: Insufficient documentation

## 2011-05-09 MED ORDER — CEFTRIAXONE SODIUM 1 G IJ SOLR
1.0000 g | Freq: Once | INTRAMUSCULAR | Status: AC
  Administered 2011-05-09: 1 g via INTRAMUSCULAR
  Filled 2011-05-09: qty 1

## 2011-05-09 MED ORDER — LIDOCAINE HCL (PF) 1 % IJ SOLN
INTRAMUSCULAR | Status: AC
  Administered 2011-05-09: 5 mL
  Filled 2011-05-09: qty 5

## 2011-05-09 MED ORDER — OXYCODONE-ACETAMINOPHEN 5-325 MG PO TABS
1.0000 | ORAL_TABLET | Freq: Once | ORAL | Status: AC
  Administered 2011-05-09: 1 via ORAL
  Filled 2011-05-09: qty 1

## 2011-05-09 MED ORDER — PROMETHAZINE HCL 12.5 MG PO TABS
12.5000 mg | ORAL_TABLET | Freq: Once | ORAL | Status: AC
  Administered 2011-05-09: 12.5 mg via ORAL
  Filled 2011-05-09: qty 1

## 2011-05-09 NOTE — ED Provider Notes (Signed)
History     CSN: [id number] Arrival date & time: 05/09/2011  6:26 PM  Chief Complaint  Patient presents with  . Wound Check    (Consider location/radiation/quality/duration/timing/severity/associated sxs/prior treatment) HPI Comments: Pt was seen about 10/4 and dx's with cellulitis, placed on antibiotics. She was scheduled to return on 10/6  and was noted to be worse. She received I and D and keflex added to the bactrim she was taking. Today  She returns for recheck. Swelling improving, pain still an issue. Pt states it is hard for her to sleep, and hard to find a comfortable position. She has noted a significant drainage from the right foot.  Patient is a 25 y.o. female presenting with wound check. The history is provided by the patient and the spouse.  Wound Check  She was treated in the ED yesterday. Previous treatment in the ED includes I&D of abscess. Treatments since wound repair include oral antibiotics. Fever duration: no fever. There has been colored discharge from the wound. The redness has improved. The swelling has improved. The pain has not changed. Difficulty Moving Extremity/Digit: unchanged.    History reviewed. No pertinent past medical history.  Past Surgical History  Procedure Date  . Cesarean section   . Tubal ligation     Family History  Problem Relation Age of Onset  . Cancer Father   . Cancer Other     History  Substance Use Topics  . Smoking status: Current Everyday Smoker -- 1.0 packs/day for 12 years    Types: Cigarettes  . Smokeless tobacco: Never Used  . Alcohol Use: No    OB History    Grav Para Term Preterm Abortions TAB SAB Ect Mult Living   4 3 3  1     3       Review of Systems  Constitutional: Negative for activity change.       All ROS Neg except as noted in HPI  HENT: Negative for nosebleeds and neck pain.   Eyes: Negative for photophobia and discharge.  Respiratory: Negative for cough, shortness of breath and wheezing.     Cardiovascular: Negative for chest pain and palpitations.  Gastrointestinal: Negative for abdominal pain and blood in stool.  Genitourinary: Negative for dysuria, frequency and hematuria.  Musculoskeletal: Negative for back pain and arthralgias.  Skin: Negative.   Neurological: Negative for dizziness, seizures and speech difficulty.  Psychiatric/Behavioral: Negative for hallucinations and confusion.    Allergies  Review of patient's allergies indicates no known allergies.  Home Medications   Current Outpatient Rx  Name Route Sig Dispense Refill  . GOODY HEADACHE PO Oral Take 1 packet by mouth once as needed. For pain    . CEPHALEXIN 500 MG PO CAPS Oral Take 2 capsules (1,000 mg total) by mouth 2 (two) times daily. 28 capsule 0  . IBUPROFEN 200 MG PO TABS Oral Take 400 mg by mouth 3 (three) times daily as needed. For pain     . IBUPROFEN 800 MG PO TABS Oral Take 1 tablet (800 mg total) by mouth 3 (three) times daily. 21 tablet 0  . OXYCODONE-ACETAMINOPHEN 5-325 MG PO TABS Oral Take 1 tablet by mouth every 6 (six) hours as needed for pain. 20 tablet 0  . OXYCODONE-ACETAMINOPHEN 5-325 MG PO TABS Oral Take 1 tablet by mouth every 4 (four) hours as needed for pain. 15 tablet 0  . SULFAMETHOXAZOLE-TRIMETHOPRIM 800-160 MG PO TABS Oral Take 1 tablet by mouth 2 (two) times daily. 14 tablet 0  BP 110/54  Pulse 67  Temp(Src) 98.6 F (37 C) (Oral)  Resp 14  Ht 5\' 1"  (1.549 m)  Wt 160 lb (72.576 kg)  BMI 30.23 kg/m2  SpO2 100%  LMP 04/05/2011  Physical Exam  Nursing note and vitals reviewed. Constitutional: She is oriented to person, place, and time. She appears well-developed and well-nourished.  Non-toxic appearance.  HENT:  Head: Normocephalic.  Right Ear: Tympanic membrane and external ear normal.  Left Ear: Tympanic membrane and external ear normal.  Eyes: EOM and lids are normal. Pupils are equal, round, and reactive to light.  Neck: Normal range of motion. Neck supple.  Carotid bruit is not present.  Cardiovascular: Normal rate, regular rhythm, normal heart sounds, intact distal pulses and normal pulses.   Pulmonary/Chest: Breath sounds normal. No respiratory distress.  Abdominal: Soft. Bowel sounds are normal. There is no tenderness. There is no guarding.  Musculoskeletal: She exhibits tenderness.       There is mod. Swelling of the toes, unchanged from initial exam. The redness has improved from yesterday. The swelling is about the same per pt. Pain with attempted ROM of the foot or ankle. FROM of he right knee and hip. Foot warm but not hot.  Lymphadenopathy:       Head (right side): No submandibular adenopathy present.       Head (left side): No submandibular adenopathy present.    She has no cervical adenopathy.  Neurological: She is alert and oriented to person, place, and time. She has normal strength. No cranial nerve deficit or sensory deficit.  Skin: Skin is warm and dry.  Psychiatric: She has a normal mood and affect. Her speech is normal.    ED Course: Packing removed by me and NEW sterile dressing applied by me. IM Rocephin given. Pt will continue keflex and bactrim. Return on 10/10 for recheck.  Procedures (including critical care time)  Labs Reviewed - No data to display No results found.   Dx: Wound recheck, abscess/cellulitis of the right foot.   MDM  I have reviewed nursing notes, vital signs, and all appropriate lab and imaging results for this patient.        Kathie Dike, [place] 05/09/11 Ernestina [place]

## 2011-05-09 NOTE — ED Notes (Signed)
Patient had abscess on right lower foot I&D done here yesterday. Patient here for wound recheck and packing removal. Per patient great improvement noted.

## 2011-05-09 NOTE — ED Provider Notes (Addendum)
Medical screening examination/treatment/procedure(s) were conducted as a shared visit with non-physician practitioner(s) and myself.  I personally evaluated the patient during the encounter.   Eye exam the patient's foot.  Pus pocket noted on the lateral dorsum aspect. I&D performed by physician's assistant.patient feeling much better after procedure Donnetta Hutching, MD  05/09/11 1610  Donnetta Hutching, MD 05/09/11 [phone]

## 2011-05-09 NOTE — ED Notes (Signed)
Pt advised that she had gotten a prescription for Percocet yesterday and she needed to return on Oct. 10th for wound recheck.

## 2011-05-09 NOTE — ED Provider Notes (Signed)
Medical screening examination/treatment/procedure(s) were performed by non-physician practitioner and as supervising physician I was immediately available for consultation/collaboration.   Benny Lennert, MD 05/09/11 2235

## 2011-05-09 NOTE — ED Notes (Signed)
Pt requesting to see Ivery Quale. Pt requesting something "stronger" for pain. EDPA notified.

## 2011-05-17 LAB — DIFFERENTIAL
Basophils Absolute: 0
Lymphocytes Relative: 17
Lymphs Abs: 2
Neutro Abs: 9.4 — ABNORMAL HIGH
Neutrophils Relative %: 76

## 2011-05-17 LAB — CBC
Hemoglobin: 9.6 — ABNORMAL LOW
MCHC: 33.8
MCHC: 34
Platelets: 210
Platelets: 274
RBC: 3.15 — ABNORMAL LOW
RDW: 13.4
RDW: 13.9
WBC: 11.3 — ABNORMAL HIGH
WBC: 12.4 — ABNORMAL HIGH

## 2011-05-17 LAB — TYPE AND SCREEN
ABO/RH(D): B POS
Antibody Screen: NEGATIVE

## 2011-05-17 LAB — RPR: RPR Ser Ql: NONREACTIVE

## 2011-07-01 ENCOUNTER — Encounter (HOSPITAL_COMMUNITY): Payer: Self-pay | Admitting: *Deleted

## 2011-07-01 ENCOUNTER — Emergency Department (HOSPITAL_COMMUNITY)
Admission: EM | Admit: 2011-07-01 | Discharge: 2011-07-01 | Disposition: A | Discharge status: Home / Self Care | Payer: Self-pay | Attending: Emergency Medicine | Admitting: Emergency Medicine

## 2011-07-01 DIAGNOSIS — R319 Hematuria, unspecified: Secondary | ICD-10-CM | POA: Insufficient documentation

## 2011-07-01 DIAGNOSIS — R350 Frequency of micturition: Secondary | ICD-10-CM | POA: Insufficient documentation

## 2011-07-01 DIAGNOSIS — R300 Dysuria: Secondary | ICD-10-CM | POA: Insufficient documentation

## 2011-07-01 DIAGNOSIS — M545 Low back pain, unspecified: Secondary | ICD-10-CM | POA: Insufficient documentation

## 2011-07-01 DIAGNOSIS — R110 Nausea: Secondary | ICD-10-CM | POA: Insufficient documentation

## 2011-07-01 DIAGNOSIS — M549 Dorsalgia, unspecified: Secondary | ICD-10-CM

## 2011-07-01 DIAGNOSIS — F17200 Nicotine dependence, unspecified, uncomplicated: Secondary | ICD-10-CM | POA: Insufficient documentation

## 2011-07-01 HISTORY — DX: Urinary tract infection, site not specified: N39.0

## 2011-07-01 LAB — URINE MICROSCOPIC-ADD ON

## 2011-07-01 LAB — URINALYSIS, ROUTINE W REFLEX MICROSCOPIC
Ketones, ur: NEGATIVE mg/dL
Leukocytes, UA: NEGATIVE
Nitrite: NEGATIVE
pH: 8 (ref 5.0–8.0)

## 2011-07-01 LAB — PREGNANCY, URINE: Preg Test, Ur: NEGATIVE

## 2011-07-01 MED ORDER — HYDROCODONE-ACETAMINOPHEN 5-325 MG PO TABS: ORAL_TABLET | ORAL | Status: AC

## 2011-07-01 MED ORDER — HYDROCODONE-ACETAMINOPHEN 5-325 MG PO TABS
1.0000 | ORAL_TABLET | Freq: Once | ORAL | Status: AC
  Administered 2011-07-01: 1 via ORAL
  Filled 2011-07-01: qty 1

## 2011-07-01 MED ORDER — CEPHALEXIN 500 MG PO CAPS
500.0000 mg | ORAL_CAPSULE | Freq: Once | ORAL | Status: AC
  Administered 2011-07-01: 500 mg via ORAL
  Filled 2011-07-01: qty 1

## 2011-07-01 MED ORDER — CEPHALEXIN 500 MG PO CAPS: 500.0000 mg | ORAL_CAPSULE | Freq: Four times a day (QID) | ORAL | Status: AC

## 2011-07-01 NOTE — ED Provider Notes (Signed)
History     CSN: [id number] Arrival date & time: 07/01/2011  1:09 PM   First MD Initiated Contact with Patient 07/01/11 1352      Chief Complaint  Patient presents with  . Back Pain    (Consider location/radiation/quality/duration/timing/severity/associated sxs/prior treatment) Patient is a 25 y.o. female presenting with back pain. The history is provided by the patient.  Back Pain  This is a new problem. The current episode started more than 2 days ago. The problem occurs constantly. The problem has not changed since onset.The pain is associated with no known injury. The pain is present in the lumbar spine. The quality of the pain is described as aching. The pain does not radiate. The pain is moderate. Exacerbated by: voiding. The pain is the same all the time. Associated symptoms include dysuria. Pertinent negatives include no chest pain, no fever, no numbness, no abdominal pain, no abdominal swelling, no bowel incontinence, no perianal numbness, no bladder incontinence, no pelvic pain, no leg pain, no paresthesias, no paresis, no tingling and no weakness. She has tried nothing for the symptoms. The treatment provided no relief. Risk factors: frequent UTI's.    Past Medical History  Diagnosis Date  . UTI (lower urinary tract infection)     Past Surgical History  Procedure Date  . Cesarean section   . Tubal ligation     Family History  Problem Relation Age of Onset  . Cancer Father   . Cancer Other     History  Substance Use Topics  . Smoking status: Current Everyday Smoker -- 1.0 packs/day for 12 years    Types: Cigarettes  . Smokeless tobacco: Never Used  . Alcohol Use: No    OB History    Grav Para Term Preterm Abortions TAB SAB Ect Mult Living   4 3 3  1     3       Review of Systems  Constitutional: Negative for fever, chills and fatigue.  HENT: Negative for sore throat, trouble swallowing, neck pain and neck stiffness.   Respiratory: Negative for cough,  shortness of breath and wheezing.   Cardiovascular: Negative for chest pain and palpitations.  Gastrointestinal: Positive for nausea. Negative for abdominal pain and bowel incontinence.  Genitourinary: Positive for dysuria, frequency and difficulty urinating. Negative for bladder incontinence, hematuria, flank pain, decreased urine volume, vaginal bleeding, vaginal discharge, vaginal pain, menstrual problem and pelvic pain.  Musculoskeletal: Positive for back pain. Negative for myalgias, arthralgias and gait problem.  Skin: Negative.  Negative for rash.  Neurological: Negative for dizziness, tingling, weakness, numbness and paresthesias.  Hematological: Does not bruise/bleed easily.  All other systems reviewed and are negative.    Allergies  Review of patient's allergies indicates no known allergies.  Home Medications   Current Outpatient Rx  Name Route Sig Dispense Refill  . ACETAMINOPHEN 500 MG PO TABS Oral Take 500 mg by mouth as needed. For pain     . GOODY HEADACHE PO Oral Take 1 packet by mouth once as needed. For pain    . IBUPROFEN 200 MG PO TABS Oral Take 400 mg by mouth 3 (three) times daily as needed. For pain       BP 105/58  Pulse 76  Temp(Src) 97.7 F (36.5 C) (Oral)  Resp 18  Ht 5\' 1"  (1.549 m)  Wt 150 lb (68.04 kg)  BMI 28.34 kg/m2  SpO2 100%  LMP 05/22/2011  Physical Exam  Nursing note and vitals reviewed. Constitutional: She is oriented to  person, place, and time. She appears well-developed and well-nourished. No distress.  HENT:  Head: Normocephalic and atraumatic.  Mouth/Throat: Oropharynx is clear and moist.  Neck: Normal range of motion. Neck supple.  Cardiovascular: Normal rate, regular rhythm and normal heart sounds.   Pulmonary/Chest: Effort normal and breath sounds normal. No respiratory distress. She exhibits no tenderness.  Abdominal: Soft. She exhibits no distension. There is no hepatosplenomegaly. There is no tenderness. There is CVA  tenderness. There is no rebound and no guarding.  Musculoskeletal: Normal range of motion. She exhibits tenderness. She exhibits no edema.       Lumbar back: She exhibits tenderness. She exhibits normal range of motion, no bony tenderness, no edema and normal pulse.  Lymphadenopathy:    She has no cervical adenopathy.  Neurological: She is alert and oriented to person, place, and time. No cranial nerve deficit or sensory deficit. She exhibits normal muscle tone. Coordination normal.  Reflex Scores:      Patellar reflexes are 2+ on the right side and 2+ on the left side.      Achilles reflexes are 2+ on the right side and 2+ on the left side. Skin: Skin is warm and dry.    ED Course  Procedures (including critical care time)  Results for orders placed during the hospital encounter of 07/01/11  PREGNANCY, URINE      Component Value Range   Preg Test, Ur NEGATIVE    URINALYSIS, ROUTINE W REFLEX MICROSCOPIC      Component Value Range   Color, Urine ORANGE (*) YELLOW    APPearance HAZY (*) CLEAR    Specific Gravity, Urine 1.015  1.005 - 1.030    pH 8.0  5.0 - 8.0    Glucose, UA NEGATIVE  NEGATIVE (mg/dL)   Hgb urine dipstick LARGE (*) NEGATIVE    Bilirubin Urine NEGATIVE  NEGATIVE    Ketones, ur NEGATIVE  NEGATIVE (mg/dL)   Protein, ur TRACE (*) NEGATIVE (mg/dL)   Urobilinogen, UA 0.2  0.0 - 1.0 (mg/dL)   Nitrite NEGATIVE  NEGATIVE    Leukocytes, UA NEGATIVE  NEGATIVE   URINE MICROSCOPIC-ADD ON      Component Value Range   Squamous Epithelial / LPF FEW (*) RARE    WBC, UA 3-6  <3 (WBC/hpf)   RBC / HPF TOO NUMEROUS TO COUNT  <3 (RBC/hpf)         MDM    3:17 PM urine culture is pending.  Pt has hx of previous UTI's and has dysuria sx's for 3 days.  Will begin keflex, advised her to return here in 1-2 days if sx's not improving.  No CVA tenderness, fever, or vomiting to suggest pyleo.  Back pain is lower and diffuse in origin, so my clinical suspicion for renal stone is low.            Scot Shiraishi L. [place], [place] 07/03/11 2038

## 2011-07-01 NOTE — ED Notes (Signed)
C/o lower back pain for last 3 days, hurts to void denies burning, and c/o nausea, denies vomiting

## 2011-07-03 LAB — URINE CULTURE: Colony Count: 7000

## 2011-07-04 NOTE — ED Provider Notes (Signed)
Medical screening examination/treatment/procedure(s) were performed by non-physician practitioner and as supervising physician I was immediately available for consultation/collaboration.   Dayton Bailiff, MD 07/04/11 [phone]

## 2011-07-09 ENCOUNTER — Emergency Department (HOSPITAL_COMMUNITY)
Admission: EM | Admit: 2011-07-09 | Discharge: 2011-07-10 | Disposition: A | Discharge status: Home / Self Care | Payer: Self-pay | Attending: Emergency Medicine | Admitting: Emergency Medicine

## 2011-07-09 ENCOUNTER — Encounter (HOSPITAL_COMMUNITY): Payer: Self-pay

## 2011-07-09 DIAGNOSIS — R319 Hematuria, unspecified: Secondary | ICD-10-CM | POA: Insufficient documentation

## 2011-07-09 DIAGNOSIS — F17200 Nicotine dependence, unspecified, uncomplicated: Secondary | ICD-10-CM | POA: Insufficient documentation

## 2011-07-09 DIAGNOSIS — M545 Low back pain, unspecified: Secondary | ICD-10-CM | POA: Insufficient documentation

## 2011-07-09 DIAGNOSIS — R110 Nausea: Secondary | ICD-10-CM | POA: Insufficient documentation

## 2011-07-09 DIAGNOSIS — R109 Unspecified abdominal pain: Secondary | ICD-10-CM | POA: Insufficient documentation

## 2011-07-09 DIAGNOSIS — R300 Dysuria: Secondary | ICD-10-CM | POA: Insufficient documentation

## 2011-07-09 NOTE — ED Notes (Signed)
Dx w/ "some sort of infection ?kidney, last week", cont. To have pain to low back and left flank, on antibiotic, out of meds for pain

## 2011-07-10 ENCOUNTER — Emergency Department (HOSPITAL_COMMUNITY): Payer: Self-pay

## 2011-07-10 ENCOUNTER — Encounter (HOSPITAL_COMMUNITY): Payer: Self-pay | Admitting: Emergency Medicine

## 2011-07-10 LAB — URINE MICROSCOPIC-ADD ON

## 2011-07-10 LAB — URINALYSIS, ROUTINE W REFLEX MICROSCOPIC
Bilirubin Urine: NEGATIVE
Nitrite: NEGATIVE
Specific Gravity, Urine: >1.03 — ABNORMAL HIGH (ref 1.005–1.030)
Urobilinogen, UA: 0.2 mg/dL (ref 0.0–1.0)

## 2011-07-10 MED ORDER — NAPROXEN 375 MG PO TABS: 375.0000 mg | ORAL_TABLET | Freq: Two times a day (BID) | ORAL | Status: AC

## 2011-07-10 MED ORDER — ONDANSETRON 8 MG PO TBDP
8.0000 mg | ORAL_TABLET | Freq: Once | ORAL | Status: AC
  Administered 2011-07-10: 8 mg via ORAL
  Filled 2011-07-10: qty 1

## 2011-07-10 MED ORDER — KETOROLAC TROMETHAMINE 60 MG/2ML IM SOLN
60.0000 mg | Freq: Once | INTRAMUSCULAR | Status: AC
  Administered 2011-07-10: 60 mg via INTRAMUSCULAR
  Filled 2011-07-10: qty 2

## 2011-07-10 MED ORDER — CEFTRIAXONE SODIUM 1 G IJ SOLR
1.0000 g | Freq: Once | INTRAMUSCULAR | Status: AC
  Administered 2011-07-10: 1 g via INTRAMUSCULAR
  Filled 2011-07-10 (×2): qty 10

## 2011-07-10 MED ORDER — OXYCODONE-ACETAMINOPHEN 5-325 MG PO TABS
1.0000 | ORAL_TABLET | Freq: Once | ORAL | Status: AC
  Administered 2011-07-10: 1 via ORAL
  Filled 2011-07-10: qty 1

## 2011-07-10 MED ORDER — TRAMADOL HCL 50 MG PO TABS: 50.0000 mg | ORAL_TABLET | Freq: Four times a day (QID) | ORAL | Status: DC | PRN

## 2011-07-10 MED ORDER — NITROFURANTOIN MONOHYD MACRO 100 MG PO CAPS: 100.0000 mg | ORAL_CAPSULE | Freq: Two times a day (BID) | ORAL | Status: AC

## 2011-07-10 NOTE — ED Provider Notes (Signed)
History     CSN: [id number] Arrival date & time: 07/09/2011 11:49 PM   First MD Initiated Contact with Patient 07/10/11 0013      Chief Complaint  Patient presents with  . Back Pain  . Flank Pain    left    (Consider location/radiation/quality/duration/timing/severity/associated sxs/prior treatment) HPI Comments: patient was seen here last week and treated for diffuse lower back pain and dysuria.  Patient states she has been taking the antibiotic as prescribed but has ran out of her pain medication and the pain moved to the left flank area 3-4 days ago.  She also continues to have "dark urine" .  She also c/o intermittent nausea.  She denies fever, abd pain or vomiting.    Patient is a 25 y.o. female presenting with back pain and flank pain. The history is provided by the patient.  Back Pain  This is a recurrent problem. The current episode started more than 1 week ago. The problem occurs constantly. The problem has been gradually worsening. The pain is associated with no known injury. Pain location: left flank and lumbar region. The quality of the pain is described as aching. The pain is moderate. The symptoms are aggravated by bending, twisting and certain positions. The pain is the same all the time. Associated symptoms include dysuria. Pertinent negatives include no chest pain, no fever, no numbness, no headaches, no abdominal pain, no abdominal swelling, no bowel incontinence, no perianal numbness, no bladder incontinence, no pelvic pain, no leg pain, no paresthesias, no paresis, no tingling and no weakness. She has tried analgesics for the symptoms. The treatment provided no relief.  Flank Pain This is a new problem. The current episode started in the past 7 days. The problem occurs constantly. The problem has been unchanged. Associated symptoms include nausea. Pertinent negatives include no abdominal pain, arthralgias, chest pain, chills, coughing, fatigue, fever, headaches, myalgias,  neck pain, numbness, rash, sore throat, vomiting or weakness. The symptoms are aggravated by nothing. The treatment provided moderate relief.    Past Medical History  Diagnosis Date  . UTI (lower urinary tract infection)     Past Surgical History  Procedure Date  . Cesarean section   . Tubal ligation     Family History  Problem Relation Age of Onset  . Cancer Father   . Cancer Other     History  Substance Use Topics  . Smoking status: Current Everyday Smoker -- 1.0 packs/day for 12 years    Types: Cigarettes  . Smokeless tobacco: Never Used  . Alcohol Use: No    OB History    Grav Para Term Preterm Abortions TAB SAB Ect Mult Living   4 3 3  1     3       Review of Systems  Constitutional: Negative for fever, chills and fatigue.  HENT: Negative for sore throat, trouble swallowing, neck pain and neck stiffness.   Respiratory: Negative for cough.   Cardiovascular: Negative for chest pain and palpitations.  Gastrointestinal: Positive for nausea. Negative for vomiting, abdominal pain and bowel incontinence.  Genitourinary: Positive for dysuria, hematuria and flank pain. Negative for bladder incontinence, frequency, vaginal bleeding, vaginal discharge, difficulty urinating, menstrual problem and pelvic pain.  Musculoskeletal: Positive for back pain. Negative for myalgias and arthralgias.  Skin: Negative.  Negative for rash.  Neurological: Negative for dizziness, tingling, weakness, numbness, headaches and paresthesias.  Hematological: Does not bruise/bleed easily.  All other systems reviewed and are negative.    Allergies  Review of patient's allergies indicates no known allergies.  Home Medications   Current Outpatient Rx  Name Route Sig Dispense Refill  . ACETAMINOPHEN 500 MG PO TABS Oral Take 500 mg by mouth as needed. For pain     . GOODY HEADACHE PO Oral Take 1 packet by mouth once as needed. For pain    . CEPHALEXIN 500 MG PO CAPS Oral Take 1 capsule (500 mg  total) by mouth 4 (four) times daily. For 7 days 28 capsule 0  . HYDROCODONE-ACETAMINOPHEN 5-325 MG PO TABS  Take one-two tabs po q 4-6 hrs prn pain 12 tablet 0  . IBUPROFEN 200 MG PO TABS Oral Take 400 mg by mouth 3 (three) times daily as needed. For pain       BP 112/56  Pulse 96  Temp(Src) 98 F (36.7 C) (Oral)  Resp 18  Ht 5\' 1"  (1.549 m)  Wt 160 lb (72.576 kg)  BMI 30.23 kg/m2  SpO2 100%  LMP 06/15/2011  Physical Exam  Nursing note and vitals reviewed. Constitutional: She is oriented to person, place, and time. She appears well-developed and well-nourished. No distress.  HENT:  Head: Normocephalic and atraumatic.  Mouth/Throat: Oropharynx is clear and moist.  Cardiovascular: Normal rate, regular rhythm and normal heart sounds.   Pulmonary/Chest: Effort normal and breath sounds normal. No respiratory distress. She exhibits no tenderness.  Abdominal: Soft. Normal appearance. She exhibits no distension. There is no hepatosplenomegaly. There is no tenderness. There is CVA tenderness. There is no rigidity and no guarding.  Musculoskeletal: Normal range of motion. She exhibits no tenderness.  Neurological: She is alert and oriented to person, place, and time. No cranial nerve deficit. She exhibits normal muscle tone. Coordination normal.  Skin: Skin is warm and dry.    ED Course  Procedures (including critical care time)  Results for orders placed during the hospital encounter of 07/09/11  URINALYSIS, ROUTINE W REFLEX MICROSCOPIC      Component Value Range   Color, Urine YELLOW  YELLOW    APPearance HAZY (*) CLEAR    Specific Gravity, Urine >1.030 (*) 1.005 - 1.030    pH 5.5  5.0 - 8.0    Glucose, UA NEGATIVE  NEGATIVE (mg/dL)   Hgb urine dipstick LARGE (*) NEGATIVE    Bilirubin Urine NEGATIVE  NEGATIVE    Ketones, ur TRACE (*) NEGATIVE (mg/dL)   Protein, ur NEGATIVE  NEGATIVE (mg/dL)   Urobilinogen, UA 0.2  0.0 - 1.0 (mg/dL)   Nitrite NEGATIVE  NEGATIVE    Leukocytes,  UA NEGATIVE  NEGATIVE   PREGNANCY, URINE      Component Value Range   Preg Test, Ur NEGATIVE    URINE MICROSCOPIC-ADD ON      Component Value Range   Squamous Epithelial / LPF FEW (*) RARE    WBC, UA 0-2  <3 (WBC/hpf)   RBC / HPF TOO NUMEROUS TO COUNT  <3 (RBC/hpf)   Bacteria, UA FEW (*) RARE    Urine-Other CLUE CELLS PRESENT       Ct Abdomen Pelvis Wo Contrast  07/10/2011  *RADIOLOGY REPORT*  Clinical Data: 1-week history of left flank pain.  Surgical history includes cesarean section.  CT ABDOMEN AND PELVIS WITHOUT CONTRAST 07/09/2006:  Technique:  Multidetector CT imaging of the abdomen and pelvis was performed following the standard protocol without intravenous contrast.  Comparison: None.  Findings: No evidence of urinary tract calculi or obstruction on either side.  Within the limits of the low dose  unenhanced technique, no focal parenchymal abnormality identified involving either kidney.  Normal low dose unenhanced appearance of the liver, spleen, pancreas, and adrenal glands.  Gallbladder contracted as there is a large amount of food in the stomach.  No biliary ductal dilation. Normal vascular structures.  No significant lymphadenopathy.  Stomach, small bowel, and colon normal in appearance.  Cecum extends low into the pelvis, and normal appearing appendix identified in the mid and low pelvis in the midline.  No ascites.  Uterus and ovaries unremarkable for age, the right ovary containing an approximate 2 cm cyst.  No free pelvic fluid.  Urinary bladder decompressed and unremarkable.  Phlebolith low in the right side of the pelvis.  Bone window images unremarkable.  Visualized lung bases clear.  Heart size normal.  IMPRESSION:  1.  No evidence of urinary tract calculi or obstruction on either side. 2.  Normal examination.  Original Report Authenticated By: Arnell Sieving, M.D.      MDM    2:04 AM patient is feeling better,  Previous Ed chart was reviewed.  abd remains soft, NT.     Urine culture from previous visit was reviewed by me. Urine also cultured tonight.   I will change her antibiotic to macrobid and give her referral for urology.      Rosan Calbert L. [place], [place] 07/10/11 0206

## 2011-07-10 NOTE — ED Provider Notes (Signed)
Medical screening examination/treatment/procedure(s) were performed by non-physician practitioner and as supervising physician I was immediately available for consultation/collaboration.  Jasmine Awe, MD 07/10/11 [phone]

## 2011-07-11 LAB — URINE CULTURE

## 2012-04-22 ENCOUNTER — Emergency Department (HOSPITAL_COMMUNITY): Payer: Self-pay

## 2012-04-22 ENCOUNTER — Emergency Department (HOSPITAL_COMMUNITY)
Admission: EM | Admit: 2012-04-22 | Discharge: 2012-04-22 | Disposition: A | Discharge status: Home / Self Care | Payer: Self-pay | Attending: Emergency Medicine | Admitting: Emergency Medicine

## 2012-04-22 ENCOUNTER — Encounter (HOSPITAL_COMMUNITY): Payer: Self-pay | Admitting: Emergency Medicine

## 2012-04-22 DIAGNOSIS — F17200 Nicotine dependence, unspecified, uncomplicated: Secondary | ICD-10-CM | POA: Insufficient documentation

## 2012-04-22 DIAGNOSIS — W1809XA Striking against other object with subsequent fall, initial encounter: Secondary | ICD-10-CM | POA: Insufficient documentation

## 2012-04-22 DIAGNOSIS — S40019A Contusion of unspecified shoulder, initial encounter: Secondary | ICD-10-CM | POA: Insufficient documentation

## 2012-04-22 DIAGNOSIS — B86 Scabies: Secondary | ICD-10-CM | POA: Insufficient documentation

## 2012-04-22 DIAGNOSIS — Y92009 Unspecified place in unspecified non-institutional (private) residence as the place of occurrence of the external cause: Secondary | ICD-10-CM | POA: Insufficient documentation

## 2012-04-22 MED ORDER — IBUPROFEN 800 MG PO TABS: 800.0000 mg | ORAL_TABLET | Freq: Three times a day (TID) | ORAL | Status: DC

## 2012-04-22 MED ORDER — PERMETHRIN 5 % EX CREA: TOPICAL_CREAM | Freq: Once | CUTANEOUS | Status: DC

## 2012-04-22 NOTE — ED Provider Notes (Signed)
History  This chart was scribed for Diana Octave, MD by Erskine Emery. This patient was seen in room APA12/APA12 and the patient's care was started at 7:24.   CSN: [id number]  Arrival date & time 04/22/12  0709   First MD Initiated Contact with Patient 04/22/12 [phone]      Chief Complaint  Patient presents with  . Fall  . Rash    (Consider location/radiation/quality/duration/timing/severity/associated sxs/prior treatment) The history is provided by the patient. No language interpreter was used.  Diana [name] is a 26 y.o. female who presents to the Emergency Department complaining of bilateral shoulder pain (worst on the left) since a fall out her front door while sleep walking around 3 am Thursday night (2 days ago). Pt had no shoulder issues prior to the incident and denies any associated LOC, fevers, emesis, neck pain, back pain, abdominal pain, or leg pain. Pt also reports her daughter was recently diagnosed with scabies and now she has noticed itching red bumps between her fingers and on her abdomen for the last week.   Past Medical History  Diagnosis Date  . UTI (lower urinary tract infection)     Past Surgical History  Procedure Date  . Cesarean section   . Tubal ligation     Family History  Problem Relation Age of Onset  . Cancer Father   . Cancer Other     History  Substance Use Topics  . Smoking status: Current Every Day Smoker -- 1.0 packs/day for 12 years    Types: Cigarettes  . Smokeless tobacco: Never Used  . Alcohol Use: No    OB History    Grav Para Term Preterm Abortions TAB SAB Ect Mult Living   4 3 3  1     3       Review of Systems A complete 10 system review of systems was obtained and all systems are negative except as noted in the HPI and PMH.    Allergies  Review of patient's allergies indicates no known allergies.  Home Medications   Current Outpatient Rx  Name Route Sig Dispense Refill  . ACETAMINOPHEN 500 MG PO TABS Oral  Take 500 mg by mouth as needed. For pain     . NAPROXEN 375 MG PO TABS Oral Take 1 tablet (375 mg total) by mouth 2 (two) times daily. 20 tablet 0  . IBUPROFEN 800 MG PO TABS Oral Take 1 tablet (800 mg total) by mouth 3 (three) times daily. 21 tablet 0  . PERMETHRIN 5 % EX CREA Topical Apply topically once. 60 g 0    Triage Vitals: BP 110/74  Pulse 62  Temp 98 F (36.7 C) (Oral)  Resp 18  Ht 5\' 1"  (1.549 m)  Wt 135 lb (61.236 kg)  BMI 25.51 kg/m2  SpO2 98%  LMP 04/15/2012  Physical Exam  Nursing note and vitals reviewed. Constitutional: She is oriented to person, place, and time. She appears well-developed and well-nourished. No distress.  HENT:  Head: Normocephalic and atraumatic.  Eyes: EOM are normal. Pupils are equal, round, and reactive to light.  Neck: Neck supple. No tracheal deviation present.       No C spine pain, stepoff or deformity.  Cardiovascular: Normal rate.   Pulmonary/Chest: Effort normal. No respiratory distress.  Abdominal: Soft. She exhibits no distension.  Musculoskeletal: Normal range of motion. She exhibits no edema.       Bilateral shoulder tenderness to palpation. Full ROM without pain. +2 radial pulses. Cranial  nerves intact. Axillary nerve sensation intact.  Neurological: She is alert and oriented to person, place, and time. No cranial nerve deficit. Coordination normal.  Skin: Skin is warm and dry.       Erythematous papules to sapce between fingers on hands bilaterally hands and on abdomen.  Psychiatric: She has a normal mood and affect.    ED Course  Procedures (including critical care time) DIAGNOSTIC STUDIES: Oxygen Saturation is 98% on room air, normal by my interpretation.    COORDINATION OF CARE: 7:35--I evaluated the patient and we discussed a treatment plan including x-rays and treatment for Scabies to which the pt agreed.   8:15--I rechecked the pt and notified her that her x-rays look fine. I instructed the pt to wash all her towels  and sheets to prevent further spread of the scabies.   Labs Reviewed - No data to display Dg Shoulder Right  04/22/2012  *RADIOLOGY REPORT*  Clinical Data: Bilateral shoulder pain following a fall  RIGHT SHOULDER - 2+ VIEW  Comparison: None.  Findings: No fracture or dislocation. The glenohumeral and AC joints are normally spaced and aligned.  The underlying ribs are intact.  The soft tissues are unremarkable.  IMPRESSION: Normal right shoulder radiographs   Original Report Authenticated By: Domenic Moras, M.D.    Dg Shoulder Left  04/22/2012  *RADIOLOGY REPORT*  Clinical Data: Fall.  Bilateral shoulder pain.  LEFT SHOULDER - 2+ VIEW  Comparison: None.  Findings: The patient was unable to externally rotate shoulder. This reduces diagnostic sensitivity and specificity.  AC joint alignment appears normal. The acromial undersurface is type 2 (curved).  No fracture is observed.  No findings of dislocation or definite evidence of Hill-Sachs impaction.  IMPRESSION:  1.  No acute findings.  Reduced sensitivity due to patient inability to externally rotate the shoulder during imaging.   Original Report Authenticated By: Dellia Cloud, M.D.      1. Shoulder contusion   2. Scabies       MDM  Fall 2 days ago now with bilateral shoulder pain after landing on right side.  Did not hit head or LOC, no vomiting. No C spine pain. No weakness, numbness, tingling. No head, neck, back, chest, or abdominal pain.  Rash consistent with scabies.    I personally performed the services described in this documentation, which was scribed in my presence.  The recorded information has been reviewed and considered.    Diana Octave, MD 04/22/12 [phone]

## 2012-04-22 NOTE — ED Notes (Signed)
Patient transported to X-ray/pt. now returned to room

## 2012-04-22 NOTE — ED Notes (Signed)
Pt. States on Thursday evening, she stepped out of back door to house and fell onto right shoulder.  Pain referred to left shoulder at this time.  Also, daughter had been diagnosed with scabies and she feels that she has it between right fingers and abdomen itches now also.

## 2012-12-26 ENCOUNTER — Encounter (INDEPENDENT_AMBULATORY_CARE_PROVIDER_SITE_OTHER): Payer: Self-pay | Admitting: *Deleted

## 2013-01-10 ENCOUNTER — Ambulatory Visit (INDEPENDENT_AMBULATORY_CARE_PROVIDER_SITE_OTHER): Payer: Medicaid Other | Admitting: Internal Medicine

## 2013-01-10 ENCOUNTER — Encounter (INDEPENDENT_AMBULATORY_CARE_PROVIDER_SITE_OTHER): Payer: Self-pay | Admitting: Internal Medicine

## 2013-01-10 DIAGNOSIS — B1920 Unspecified viral hepatitis C without hepatic coma: Secondary | ICD-10-CM | POA: Insufficient documentation

## 2013-01-10 DIAGNOSIS — F1990 Other psychoactive substance use, unspecified, uncomplicated: Secondary | ICD-10-CM

## 2013-01-10 DIAGNOSIS — F1910 Other psychoactive substance abuse, uncomplicated: Secondary | ICD-10-CM

## 2013-01-10 LAB — COMPREHENSIVE METABOLIC PANEL
Alkaline Phosphatase: 50 U/L (ref 39–117)
BUN: 9 mg/dL (ref 6–23)
Creat: 0.71 mg/dL (ref 0.50–1.10)
Glucose, Bld: 82 mg/dL (ref 70–99)
Sodium: 140 mEq/L (ref 135–145)
Total Bilirubin: 0.3 mg/dL (ref 0.3–1.2)
Total Protein: 7 g/dL (ref 6.0–8.3)

## 2013-01-10 LAB — CBC WITH DIFFERENTIAL/PLATELET
Eosinophils Absolute: 0.1 10*3/uL (ref 0.0–0.7)
Eosinophils Relative: 1 % (ref 0–5)
HCT: 35.2 % — ABNORMAL LOW (ref 36.0–46.0)
Hemoglobin: 11.5 g/dL — ABNORMAL LOW (ref 12.0–15.0)
Lymphocytes Relative: 30 % (ref 12–46)
Lymphs Abs: 1.7 10*3/uL (ref 0.7–4.0)
MCH: 27.8 pg (ref 26.0–34.0)
MCV: 85 fL (ref 78.0–100.0)
Monocytes Relative: 8 % (ref 3–12)
RBC: 4.14 MIL/uL (ref 3.87–5.11)

## 2013-01-10 NOTE — Patient Instructions (Addendum)
Labs, OV in 3 months 

## 2013-01-10 NOTE — Progress Notes (Signed)
Subjective:     Patient ID: Diana [name], female   DOB: [birth year], 27 y.o.   MRN: [id number]  HPI Referred to our office b y [facility] in [place]  for Hepatitis C. Diagnosed last month. Small tattoo on her left ankle.  Hx of IV drug use. She is taking classes ([employer]) for her drug addiction.  She is presently taking Oxycodone IV. She buys the Oxycodone on the street. She has been doing IV drugs for 2 yrs. She denies using other drugs.  Appetite is good. She has unintentional weight loss over the past 2 yrs. Her weight was 170. Now is 129.8  No abdominal pain.  Usually has a BM once a week or twice a week.   She is unemployed. 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      12/06/2012: ALP 51, AST 10, ALT 11, Total protein 7.3, Albumin 4.3, Calcium 9.5 Hepatitis B surface antigen negative, Hepatitis B core ab, IGM negative,  Hepatitis A antibody, IgM negative, Hepatitis C antibody reactive HIV 1 and 2ab non-reactive  Review of Systems see hpi Current Outpatient Prescriptions  Medication Sig Dispense Refill  . acetaminophen (TYLENOL) 500 MG tablet Take 500 mg by mouth as needed. For pain       . ibuprofen (ADVIL,MOTRIN) 800 MG tablet Take 1 tablet (800 mg total) by mouth 3 (three) times daily.  21 tablet  0   No current facility-administered medications for this visit.   Past Medical History  Diagnosis Date  . UTI (lower urinary tract infection)   . Hepatitis C    Past Surgical History  Procedure Laterality Date  . Cesarean section      x 3  . Tubal ligation     No Known Allergies      Objective:   Physical Exam  Filed Vitals:   01/10/13 1116  BP: 96/54  Pulse: 76  Temp: 98.3 F (36.8 C)  Height: 5\' 1"  (1.549 m)  Weight: 129 lb 12.8 oz (58.877 kg)   Alert and oriented. Skin warm and dry. Oral mucosa is moist.   . Sclera anicteric, conjunctivae is pink. Thyroid not enlarged. No cervical  lymphadenopathy. Lungs clear. Heart regular rate and rhythm.  Abdomen is soft. Bowel sounds are positive. No hepatomegaly. No abdominal masses felt. No tenderness.  No edema to lower extremities.   Multiple tract marks to both arms.       Assessment:    Hepatitis C. Hx of IV drug use. We are not going to treat at this time due to her IV drug use. Will get labs today however.      Plan:   CBC, PT/INR, CMET, Hep C quaint, Hep C genotype.  OV in 3 month

## 2013-01-12 LAB — HEPATITIS C RNA QUANTITATIVE: HCV Quantitative: NOT DETECTED IU/mL (ref <15)

## 2013-01-15 ENCOUNTER — Telehealth (INDEPENDENT_AMBULATORY_CARE_PROVIDER_SITE_OTHER): Payer: Self-pay | Admitting: Internal Medicine

## 2013-01-15 LAB — HEPATITIS C GENOTYPE

## 2013-01-15 NOTE — Telephone Encounter (Signed)
I discussed with Dr. Karilyn Cota. OV in 3 months. Will do one more Hep C  Quantitative

## 2013-01-29 ENCOUNTER — Encounter (INDEPENDENT_AMBULATORY_CARE_PROVIDER_SITE_OTHER): Payer: Self-pay

## 2013-04-16 ENCOUNTER — Ambulatory Visit (INDEPENDENT_AMBULATORY_CARE_PROVIDER_SITE_OTHER): Payer: Medicaid Other | Admitting: Internal Medicine

## 2013-05-21 ENCOUNTER — Emergency Department (HOSPITAL_COMMUNITY)
Admission: EM | Admit: 2013-05-21 | Discharge: 2013-05-21 | Disposition: A | Discharge status: Home / Self Care | Payer: Medicaid Other | Attending: Emergency Medicine | Admitting: Emergency Medicine

## 2013-05-21 ENCOUNTER — Encounter (HOSPITAL_COMMUNITY): Payer: Self-pay | Admitting: Emergency Medicine

## 2013-05-21 DIAGNOSIS — Z3202 Encounter for pregnancy test, result negative: Secondary | ICD-10-CM | POA: Insufficient documentation

## 2013-05-21 DIAGNOSIS — Z792 Long term (current) use of antibiotics: Secondary | ICD-10-CM | POA: Insufficient documentation

## 2013-05-21 DIAGNOSIS — R300 Dysuria: Secondary | ICD-10-CM | POA: Insufficient documentation

## 2013-05-21 DIAGNOSIS — M545 Low back pain, unspecified: Secondary | ICD-10-CM | POA: Insufficient documentation

## 2013-05-21 DIAGNOSIS — J209 Acute bronchitis, unspecified: Secondary | ICD-10-CM | POA: Insufficient documentation

## 2013-05-21 DIAGNOSIS — J40 Bronchitis, not specified as acute or chronic: Secondary | ICD-10-CM

## 2013-05-21 DIAGNOSIS — F17200 Nicotine dependence, unspecified, uncomplicated: Secondary | ICD-10-CM | POA: Insufficient documentation

## 2013-05-21 DIAGNOSIS — Z8619 Personal history of other infectious and parasitic diseases: Secondary | ICD-10-CM | POA: Insufficient documentation

## 2013-05-21 DIAGNOSIS — Z87440 Personal history of urinary (tract) infections: Secondary | ICD-10-CM | POA: Insufficient documentation

## 2013-05-21 DIAGNOSIS — Z79899 Other long term (current) drug therapy: Secondary | ICD-10-CM | POA: Insufficient documentation

## 2013-05-21 LAB — URINALYSIS, ROUTINE W REFLEX MICROSCOPIC
Nitrite: NEGATIVE
Protein, ur: NEGATIVE mg/dL
Specific Gravity, Urine: 1.025 (ref 1.005–1.030)
Urobilinogen, UA: 1 mg/dL (ref 0.0–1.0)

## 2013-05-21 LAB — URINE MICROSCOPIC-ADD ON

## 2013-05-21 MED ORDER — ALBUTEROL SULFATE HFA 108 (90 BASE) MCG/ACT IN AERS: 1.0000 | INHALATION_SPRAY | Freq: Four times a day (QID) | RESPIRATORY_TRACT | Status: DC | PRN

## 2013-05-21 MED ORDER — DOXYCYCLINE HYCLATE 100 MG PO TABS
100.0000 mg | ORAL_TABLET | Freq: Once | ORAL | Status: AC
  Administered 2013-05-21: 100 mg via ORAL
  Filled 2013-05-21: qty 1

## 2013-05-21 MED ORDER — DOXYCYCLINE HYCLATE 100 MG PO CAPS: 100.0000 mg | ORAL_CAPSULE | Freq: Two times a day (BID) | ORAL | Status: DC

## 2013-05-21 MED ORDER — ALBUTEROL SULFATE HFA 108 (90 BASE) MCG/ACT IN AERS
2.0000 | INHALATION_SPRAY | Freq: Once | RESPIRATORY_TRACT | Status: AC
  Administered 2013-05-21: 2 via RESPIRATORY_TRACT
  Filled 2013-05-21: qty 6.7

## 2013-05-21 NOTE — ED Notes (Signed)
Patient given discharge instruction, verbalized understand. Patient ambulatory out of the department.  

## 2013-05-21 NOTE — ED Provider Notes (Signed)
CSN: [id number]     Arrival date & time 05/21/13  1629 History   First MD Initiated Contact with Patient 05/21/13 1929     Chief Complaint  Patient presents with  . chest congestion   . Cough  . Back Pain  . Dysuria   Patient is a 27 y.o. female presenting with cough. The history is provided by the patient.  Cough Cough characteristics:  Productive Sputum characteristics:  Clear Severity:  Moderate Onset quality:  Gradual Duration:  2 weeks Timing:  Intermittent Progression:  Worsening Smoker: yes   Relieved by:  None tried Worsened by:  Nothing tried Associated symptoms: shortness of breath and wheezing   Associated symptoms: no fever   pt reports cough for 2 weeks No hemoptysis No CP. She does report wheezing and SOB  She also reports dysuria and right flank pain for 3 days.  She thinks she may have UTI   Past Medical History  Diagnosis Date  . UTI (lower urinary tract infection)   . Hepatitis C    Past Surgical History  Procedure Laterality Date  . Cesarean section      x 3  . Tubal ligation     Family History  Problem Relation Age of Onset  . Cancer Father   . Cancer Other    History  Substance Use Topics  . Smoking status: Current Every Day Smoker -- 1.00 packs/day for 12 years    Types: Cigarettes  . Smokeless tobacco: Never Used  . Alcohol Use: No   OB History   Grav Para Term Preterm Abortions TAB SAB Ect Mult Living   4 3 3  1     3      Review of Systems  Constitutional: Negative for fever.  Respiratory: Positive for cough, shortness of breath and wheezing.   Gastrointestinal: Negative for vomiting.  Genitourinary: Positive for dysuria. Negative for vaginal bleeding.  Neurological: Negative for weakness.  All other systems reviewed and are negative.    Allergies  Review of patient's allergies indicates no known allergies.  Home Medications   Current Outpatient Rx  Name  Route  Sig  Dispense  Refill  . acetaminophen (TYLENOL) 500 MG  tablet   Oral   Take 500 mg by mouth as needed. For pain          . ibuprofen (ADVIL,MOTRIN) 200 MG tablet   Oral   Take 200 mg by mouth every 6 (six) hours as needed for pain.         Marland Kitchen Phenylephrine-Acetaminophen (VICKS DAYQUIL SINUS) 5-325 MG CAPS   Oral   Take 2 capsules by mouth as needed (for sinus relief).         Marland Kitchen albuterol (PROVENTIL HFA;VENTOLIN HFA) 108 (90 BASE) MCG/ACT inhaler   Inhalation   Inhale 1-2 puffs into the lungs every 6 (six) hours as needed for wheezing.   1 Inhaler   0   . doxycycline (VIBRAMYCIN) 100 MG capsule   Oral   Take 1 capsule (100 mg total) by mouth 2 (two) times daily.   20 capsule   0    BP 111/66  Pulse 90  Temp(Src) 98.7 F (37.1 C) (Oral)  Resp 18  Ht 5\' 1"  (1.549 m)  Wt 127 lb (57.607 kg)  BMI 24.01 kg/m2  SpO2 95%  LMP 05/07/2013 Physical Exam CONSTITUTIONAL: Well developed/well nourished HEAD: Normocephalic/atraumatic EYES: EOMI/PERRL ENMT: Mucous membranes moist NECK: supple no meningeal signs SPINE:entire spine nontender CV: S1/S2 noted, no murmurs/rubs/gallops  noted LUNGS: coarse wheezing bilaterally.  No distress noted. She is able to speak to me clearly ABDOMEN: soft, nontender, no rebound or guarding GU:no cva tenderness NEURO: Pt is awake/alert, moves all extremitiesx4 EXTREMITIES: pulses normal, full ROM SKIN: warm, color normal PSYCH: no abnormalities of mood noted  ED Course  Procedures (including critical care time) Labs Review Labs Reviewed  URINALYSIS, ROUTINE W REFLEX MICROSCOPIC - Abnormal; Notable for the following:    Color, Urine AMBER (*)    APPearance HAZY (*)    Hgb urine dipstick LARGE (*)    Bilirubin Urine SMALL (*)    Ketones, ur 40 (*)    All other components within normal limits  URINE CULTURE  URINE MICROSCOPIC-ADD ON  PREGNANCY, URINE   Imaging Review No results found.  EKG Interpretation   None       MDM   1. Bronchitis   2. Dysuria    Nursing notes  including past medical history and social history reviewed and considered in documentation Labs/vital reviewed and considered  Advised to stop smoking Albuterol given as well as doxycycline.  Pt is stable for d/c home      Joya Gaskins, MD 05/21/13 2210

## 2013-05-21 NOTE — ED Notes (Addendum)
Cough, congestion x 2 weeks, taking OTCs w/out relief.  Burning w/urination x 3 days w/mid to lower R flank pain.  Also report sore throat.

## 2013-05-21 NOTE — ED Notes (Signed)
Called [facility]

## 2013-05-23 LAB — URINE CULTURE: Colony Count: 9000

## 2013-10-22 ENCOUNTER — Encounter (HOSPITAL_COMMUNITY): Payer: Self-pay | Admitting: Emergency Medicine

## 2013-10-22 ENCOUNTER — Emergency Department (HOSPITAL_COMMUNITY): Payer: Medicaid Other

## 2013-10-22 ENCOUNTER — Emergency Department (HOSPITAL_COMMUNITY)
Admission: EM | Admit: 2013-10-22 | Discharge: 2013-10-22 | Disposition: A | Discharge status: Home / Self Care | Payer: Medicaid Other | Attending: Emergency Medicine | Admitting: Emergency Medicine

## 2013-10-22 DIAGNOSIS — F1210 Cannabis abuse, uncomplicated: Secondary | ICD-10-CM | POA: Insufficient documentation

## 2013-10-22 DIAGNOSIS — R059 Cough, unspecified: Secondary | ICD-10-CM | POA: Insufficient documentation

## 2013-10-22 DIAGNOSIS — Z87440 Personal history of urinary (tract) infections: Secondary | ICD-10-CM | POA: Insufficient documentation

## 2013-10-22 DIAGNOSIS — F1410 Cocaine abuse, uncomplicated: Secondary | ICD-10-CM | POA: Insufficient documentation

## 2013-10-22 DIAGNOSIS — F1910 Other psychoactive substance abuse, uncomplicated: Secondary | ICD-10-CM

## 2013-10-22 DIAGNOSIS — R05 Cough: Secondary | ICD-10-CM | POA: Insufficient documentation

## 2013-10-22 DIAGNOSIS — F17200 Nicotine dependence, unspecified, uncomplicated: Secondary | ICD-10-CM | POA: Insufficient documentation

## 2013-10-22 DIAGNOSIS — Z3202 Encounter for pregnancy test, result negative: Secondary | ICD-10-CM | POA: Insufficient documentation

## 2013-10-22 DIAGNOSIS — Z8619 Personal history of other infectious and parasitic diseases: Secondary | ICD-10-CM | POA: Insufficient documentation

## 2013-10-22 LAB — URINALYSIS, ROUTINE W REFLEX MICROSCOPIC
BILIRUBIN URINE: NEGATIVE
Glucose, UA: NEGATIVE mg/dL
Hgb urine dipstick: NEGATIVE
Ketones, ur: NEGATIVE mg/dL
Leukocytes, UA: NEGATIVE
NITRITE: NEGATIVE
Protein, ur: NEGATIVE mg/dL
Specific Gravity, Urine: >1.03 — ABNORMAL HIGH (ref 1.005–1.030)
UROBILINOGEN UA: 0.2 mg/dL (ref 0.0–1.0)
pH: 6 (ref 5.0–8.0)

## 2013-10-22 LAB — CBC WITH DIFFERENTIAL/PLATELET
BASOS ABS: 0 10*3/uL (ref 0.0–0.1)
Basophils Relative: 1 % (ref 0–1)
EOS PCT: 2 % (ref 0–5)
Eosinophils Absolute: 0.1 10*3/uL (ref 0.0–0.7)
HCT: 34.8 % — ABNORMAL LOW (ref 36.0–46.0)
Hemoglobin: 11.4 g/dL — ABNORMAL LOW (ref 12.0–15.0)
LYMPHS PCT: 37 % (ref 12–46)
Lymphs Abs: 1.8 10*3/uL (ref 0.7–4.0)
MCH: 28.1 pg (ref 26.0–34.0)
MCHC: 32.8 g/dL (ref 30.0–36.0)
MCV: 85.9 fL (ref 78.0–100.0)
Monocytes Absolute: 0.3 10*3/uL (ref 0.1–1.0)
Monocytes Relative: 6 % (ref 3–12)
Neutro Abs: 2.6 10*3/uL (ref 1.7–7.7)
Neutrophils Relative %: 55 % (ref 43–77)
PLATELETS: 250 10*3/uL (ref 150–400)
RBC: 4.05 MIL/uL (ref 3.87–5.11)
RDW: 14.4 % (ref 11.5–15.5)
WBC: 4.8 10*3/uL (ref 4.0–10.5)

## 2013-10-22 LAB — COMPREHENSIVE METABOLIC PANEL
ALT: 6 U/L (ref 0–35)
AST: 11 U/L (ref 0–37)
Albumin: 3.4 g/dL — ABNORMAL LOW (ref 3.5–5.2)
Alkaline Phosphatase: 57 U/L (ref 39–117)
BUN: 14 mg/dL (ref 6–23)
CHLORIDE: 100 meq/L (ref 96–112)
CO2: 29 meq/L (ref 19–32)
Calcium: 9.2 mg/dL (ref 8.4–10.5)
Creatinine, Ser: 0.69 mg/dL (ref 0.50–1.10)
Glucose, Bld: 94 mg/dL (ref 70–99)
POTASSIUM: 4.1 meq/L (ref 3.7–5.3)
SODIUM: 137 meq/L (ref 137–147)
Total Protein: 7.5 g/dL (ref 6.0–8.3)

## 2013-10-22 LAB — RAPID URINE DRUG SCREEN, HOSP PERFORMED
Amphetamines: NOT DETECTED
Barbiturates: NOT DETECTED
Benzodiazepines: NOT DETECTED
Cocaine: POSITIVE — AB
Opiates: NOT DETECTED
Tetrahydrocannabinol: POSITIVE — AB

## 2013-10-22 LAB — ETHANOL

## 2013-10-22 LAB — PREGNANCY, URINE: Preg Test, Ur: NEGATIVE

## 2013-10-22 NOTE — ED Notes (Signed)
Labs faxed to [facility], spoke with Eber Jonesarolyn

## 2013-10-22 NOTE — ED Provider Notes (Signed)
CSN: [id number]     Arrival date & time 10/22/13  4098 History  This chart was scribed for Shelda Jakes, MD by Leone Payor, ED Scribe. This patient was seen in room APA05/APA05 and the patient's care was started 10:39 AM.    Chief Complaint  Patient presents with  . Medical Clearance      The history is provided by the patient. No language interpreter was used.    HPI Comments: Diana [name] is a 28 y.o. female who presents to the Emergency Department requesting detox from opiates today. Pt was sent here by [facility] to be medically cleared. She reports using cocaine in February but states her main concern is the opiate abuse. She uses these drugs via injection. She has not undergone detox in the pain. She also has a cough for the past few days.   Past Medical History  Diagnosis Date  . UTI (lower urinary tract infection)   . Hepatitis C    Past Surgical History  Procedure Laterality Date  . Cesarean section      x 3  . Tubal ligation     Family History  Problem Relation Age of Onset  . Cancer Father   . Cancer Other    History  Substance Use Topics  . Smoking status: Current Every Day Smoker -- 1.00 packs/day for 12 years    Types: Cigarettes  . Smokeless tobacco: Never Used  . Alcohol Use: No   OB History   Grav Para Term Preterm Abortions TAB SAB Ect Mult Living   4 3 3  1     3      Review of Systems  Constitutional: Negative for fever and chills.  HENT: Negative for rhinorrhea and sore throat.   Eyes: Negative for visual disturbance.  Respiratory: Positive for cough. Negative for shortness of breath.   Cardiovascular: Negative for chest pain and leg swelling.  Gastrointestinal: Negative for nausea, vomiting, abdominal pain and diarrhea.  Genitourinary: Negative for dysuria.  Musculoskeletal: Negative for back pain and neck pain.  Skin: Negative for rash.  Neurological: Negative for headaches.  Hematological: Does not bruise/bleed easily.   Psychiatric/Behavioral: Negative for confusion.      Allergies  Review of patient's allergies indicates no known allergies.  Home Medications  No current outpatient prescriptions on file. BP 105/34  Pulse 56  Temp(Src) 98.2 F (36.8 C)  Resp 18  Ht 5\' 1"  (1.549 m)  Wt 130 lb (58.968 kg)  BMI 24.58 kg/m2  SpO2 100%  LMP 09/06/2013 Physical Exam  Nursing note and vitals reviewed. Constitutional: She is oriented to person, place, and time. She appears well-developed and well-nourished.  HENT:  Head: Normocephalic and atraumatic.  Mouth/Throat: Oropharynx is clear and moist.  Eyes: EOM are normal.  Cardiovascular: Normal rate, regular rhythm and normal heart sounds.   Pulmonary/Chest: Effort normal and breath sounds normal. No respiratory distress. She has no wheezes. She has no rales. She exhibits no tenderness.  Abdominal: Soft. Bowel sounds are normal. She exhibits no distension. There is no tenderness. There is no rebound and no guarding.  Musculoskeletal: Normal range of motion. She exhibits no edema and no tenderness.  Neurological: She is alert and oriented to person, place, and time. No cranial nerve deficit.  Skin: Skin is warm and dry.  Psychiatric: She has a normal mood and affect.    ED Course  Procedures (including critical care time)  DIAGNOSTIC STUDIES: Oxygen Saturation is 100% on RA, normal by my  interpretation.    COORDINATION OF CARE: 10:41 AM Discussed treatment plan with pt at bedside and pt agreed to plan.   Labs Review Labs Reviewed  CBC WITH DIFFERENTIAL - Abnormal; Notable for the following:    Hemoglobin 11.4 (*)    HCT 34.8 (*)    All other components within normal limits  URINE RAPID DRUG SCREEN (HOSP PERFORMED) - Abnormal; Notable for the following:    Cocaine POSITIVE (*)    Tetrahydrocannabinol POSITIVE (*)    All other components within normal limits  COMPREHENSIVE METABOLIC PANEL - Abnormal; Notable for the following:    Albumin  3.4 (*)    Total Bilirubin <0.2 (*)    All other components within normal limits  URINALYSIS, ROUTINE W REFLEX MICROSCOPIC - Abnormal; Notable for the following:    Specific Gravity, Urine >1.030 (*)    All other components within normal limits  ETHANOL  PREGNANCY, URINE   Results for orders placed during the hospital encounter of 10/22/13  CBC WITH DIFFERENTIAL      Result Value Ref Range   WBC 4.8  4.0 - 10.5 K/uL   RBC 4.05  3.87 - 5.11 MIL/uL   Hemoglobin 11.4 (*) 12.0 - 15.0 g/dL   HCT 11.934.8 (*) 14.736.0 - 82.946.0 %   MCV 85.9  78.0 - 100.0 fL   MCH 28.1  26.0 - 34.0 pg   MCHC 32.8  30.0 - 36.0 g/dL   RDW 56.214.4  13.011.5 - 86.515.5 %   Platelets 250  150 - 400 K/uL   Neutrophils Relative % 55  43 - 77 %   Neutro Abs 2.6  1.7 - 7.7 K/uL   Lymphocytes Relative 37  12 - 46 %   Lymphs Abs 1.8  0.7 - 4.0 K/uL   Monocytes Relative 6  3 - 12 %   Monocytes Absolute 0.3  0.1 - 1.0 K/uL   Eosinophils Relative 2  0 - 5 %   Eosinophils Absolute 0.1  0.0 - 0.7 K/uL   Basophils Relative 1  0 - 1 %   Basophils Absolute 0.0  0.0 - 0.1 K/uL  ETHANOL      Result Value Ref Range   Alcohol, Ethyl (B) <11  0 - 11 mg/dL  URINE RAPID DRUG SCREEN (HOSP PERFORMED)      Result Value Ref Range   Opiates NONE DETECTED  NONE DETECTED   Cocaine POSITIVE (*) NONE DETECTED   Benzodiazepines NONE DETECTED  NONE DETECTED   Amphetamines NONE DETECTED  NONE DETECTED   Tetrahydrocannabinol POSITIVE (*) NONE DETECTED   Barbiturates NONE DETECTED  NONE DETECTED  COMPREHENSIVE METABOLIC PANEL      Result Value Ref Range   Sodium 137  137 - 147 mEq/L   Potassium 4.1  3.7 - 5.3 mEq/L   Chloride 100  96 - 112 mEq/L   CO2 29  19 - 32 mEq/L   Glucose, Bld 94  70 - 99 mg/dL   BUN 14  6 - 23 mg/dL   Creatinine, Ser 7.840.69  0.50 - 1.10 mg/dL   Calcium 9.2  8.4 - 69.610.5 mg/dL   Total Protein 7.5  6.0 - 8.3 g/dL   Albumin 3.4 (*) 3.5 - 5.2 g/dL   AST 11  0 - 37 U/L   ALT 6  0 - 35 U/L   Alkaline Phosphatase 57  39 - 117 U/L    Total Bilirubin <0.2 (*) 0.3 - 1.2 mg/dL   GFR calc non  Af Amer >90  >90 mL/min   GFR calc Af Amer >90  >90 mL/min  URINALYSIS, ROUTINE W REFLEX MICROSCOPIC      Result Value Ref Range   Color, Urine YELLOW  YELLOW   APPearance CLEAR  CLEAR   Specific Gravity, Urine >1.030 (*) 1.005 - 1.030   pH 6.0  5.0 - 8.0   Glucose, UA NEGATIVE  NEGATIVE mg/dL   Hgb urine dipstick NEGATIVE  NEGATIVE   Bilirubin Urine NEGATIVE  NEGATIVE   Ketones, ur NEGATIVE  NEGATIVE mg/dL   Protein, ur NEGATIVE  NEGATIVE mg/dL   Urobilinogen, UA 0.2  0.0 - 1.0 mg/dL   Nitrite NEGATIVE  NEGATIVE   Leukocytes, UA NEGATIVE  NEGATIVE  PREGNANCY, URINE      Result Value Ref Range   Preg Test, Ur NEGATIVE  NEGATIVE    Imaging Review Dg Chest 2 View  10/22/2013   CLINICAL DATA:  Medical clearance for detox.  EXAM: CHEST  2 VIEW  COMPARISON:  05/27/2010  FINDINGS: The heart size and mediastinal contours are within normal limits. Both lungs are clear. The visualized skeletal structures are unremarkable.  IMPRESSION: No active cardiopulmonary disease.   Electronically Signed   By: Loralie Champagne M.D.   On: 10/22/2013 12:24     EKG Interpretation None      MDM   Final diagnoses:  Substance abuse    Patient medically cleared for detox [facility] labs that were required by day Loraine Leriche have been completed. Patient admitted on the opiate abuse however was positive for cocaine as well. Patient is nontoxic no acute distress stable here.  I personally performed the services described in this documentation, which was scribed in my presence. The recorded information has been reviewed and is accurate.    Shelda Jakes, MD 10/22/13 [phone]

## 2013-10-22 NOTE — Discharge Instructions (Signed)
Patient medically cleared for narcotic detox [facility]. Place is being held for her.

## 2013-10-22 NOTE — ED Notes (Signed)
Pt here for medical clearance to go to detox from opiates. Last use 0700 this am by injection. Denies si/hi.

## 2014-06-03 ENCOUNTER — Encounter (HOSPITAL_COMMUNITY): Payer: Self-pay | Admitting: Emergency Medicine

## 2016-01-12 ENCOUNTER — Encounter (HOSPITAL_COMMUNITY): Payer: Self-pay | Admitting: *Deleted

## 2016-01-12 ENCOUNTER — Emergency Department (HOSPITAL_COMMUNITY)
Admission: EM | Admit: 2016-01-12 | Discharge: 2016-01-12 | Discharge status: Against Medical Advice | Payer: Medicaid Other | Attending: Emergency Medicine | Admitting: Emergency Medicine

## 2016-01-12 DIAGNOSIS — F17210 Nicotine dependence, cigarettes, uncomplicated: Secondary | ICD-10-CM | POA: Insufficient documentation

## 2016-01-12 DIAGNOSIS — R109 Unspecified abdominal pain: Secondary | ICD-10-CM | POA: Insufficient documentation

## 2016-01-12 NOTE — ED Notes (Addendum)
Pt c/o abd that radiates to  chest area that started 4-5 days ago, denise any n/v, last used iv cocaine 24 hours ago,

## 2016-01-12 NOTE — ED Provider Notes (Addendum)
6:40 AM  MSE was initiated and I personally evaluated the patient and placed orders (if any) at  6:43 AM on January 12, 2016.  The patient appears stable so that the remainder of the MSE may be completed by another provider.   Pt is a 30 y.o. female with history of hepatitis C, IV cocaine abuse, tobacco use who presents to the emergency department with right-sided abdominal pain, flank pain that started 4-5 days ago and is described as sharp. No nausea, vomiting or diarrhea. No fever. No history of abdominal surgery. States she will have some central chest pain that comes and goes but none currently. Last use IV cocaine 24-36 hours ago. On exam, patient is tender throughout the right abdomen and right flank. Abdomen is non-peritoneal. She is hemodynamically stable. Will place holding orders for labs, urine and saline lock. I feel she is stable and can wait for oncoming provider for further workup.  Layla MawKristen N Eriyonna Matsushita, DO 01/12/16 0646   7:20 AM  Per nursing staff, patient was asking about IV pain medication. Nursing staff told patient that nothing had been ordered at this time for pain. I had discussed this with patient and her significant other at bedside that she would be evaluated by the oncoming provider who will decide further workup. According to nursing staff, patient came very agitated, started yelling and cursing. Refused IV, blood work and urine. Patient left the emergency department against medical advise prior to anyone talking to her about risk of leaving AGAINST MEDICAL ADVICE. She left without telling anyone she was leaving. Suspect there were some component of drug-seeking behavior present.  Layla MawKristen N Maurisio Ruddy, DO 01/12/16 [phone]

## 2016-01-12 NOTE — ED Notes (Signed)
In to start saline lock and get blood work. Patient asking about IV pain medication informed patient no orders at this time. Patient upset started yelling and using profanity, refused IV, blood work and urine. Ward informed, per her no pain medication

## 2016-01-12 NOTE — ED Notes (Signed)
Went by patient room, pt gown noted to stretcher. Pt nor pt family found at bedside or within department. EDP aware and reported to document elopement.

## 2017-03-20 ENCOUNTER — Encounter (HOSPITAL_COMMUNITY): Payer: Self-pay | Admitting: *Deleted

## 2017-03-20 ENCOUNTER — Emergency Department (HOSPITAL_COMMUNITY)
Admission: EM | Admit: 2017-03-20 | Discharge: 2017-03-20 | Disposition: A | Discharge status: Home / Self Care | Payer: Medicaid Other | Attending: Emergency Medicine | Admitting: Emergency Medicine

## 2017-03-20 DIAGNOSIS — F17210 Nicotine dependence, cigarettes, uncomplicated: Secondary | ICD-10-CM | POA: Insufficient documentation

## 2017-03-20 DIAGNOSIS — K0889 Other specified disorders of teeth and supporting structures: Secondary | ICD-10-CM

## 2017-03-20 MED ORDER — AMOXICILLIN 500 MG PO CAPS: 500.0000 mg | ORAL_CAPSULE | Freq: Three times a day (TID) | ORAL | 0 refills | Status: AC

## 2017-03-20 MED ORDER — CLINDAMYCIN HCL 150 MG PO CAPS
300.0000 mg | ORAL_CAPSULE | Freq: Once | ORAL | Status: AC
Start: 2017-03-20 — End: 2017-03-20
  Administered 2017-03-20: 300 mg via ORAL
  Filled 2017-03-20: qty 2

## 2017-03-20 NOTE — ED Provider Notes (Signed)
[facility]-EMER[facility]ENCY DEPT Provider Note   CSN: [id number] Arrival date & time: 03/20/17  3244     History   Chief Complaint Chief Complaint  Patient presents with  . Dental Pain    HPI Diana [name] is a 31 y.o. female.  Complains of dental pain in her right upper maxilla area for unknown length of time. Patient is in poor health secondary to IV drug use. No fever, sweats, chills, stiff neck, mental status changes. She has not been to the dentist. Severity of symptoms is moderate.      Past Medical History:  Diagnosis Date  . Hepatitis C   . UTI (lower urinary tract infection)     Patient Active Problem List   Diagnosis Date Noted  . Hepatitis C 01/10/2013  . IV drug user 01/10/2013    Past Surgical History:  Procedure Laterality Date  . CESAREAN SECTION     x 3  . TUBAL LIGATION      OB History    Gravida Para Term Preterm AB Living   4 3 3   1 3    SAB TAB Ectopic Multiple Live Births                   Home Medications    Prior to Admission medications   Medication Sig Start Date End Date Taking? Authorizing Provider  amoxicillin (AMOXIL) 500 MG capsule Take 1 capsule (500 mg total) by mouth 3 (three) times daily. 03/20/17   Donnetta Hutching, MD    Family History Family History  Problem Relation Age of Onset  . Cancer Father   . Cancer Other     Social History Social History  Substance Use Topics  . Smoking status: Current Every Day Smoker    Packs/day: 1.00    Years: 12.00    Types: Cigarettes  . Smokeless tobacco: Never Used  . Alcohol use No     Allergies   Patient has no known allergies.   Review of Systems Review of Systems  All other systems reviewed and are negative.    Physical Exam Updated Vital Signs BP (!) 106/58 (BP Location: Right Arm)   Pulse 82   Temp 98.7 F (37.1 C) (Oral)   Resp 16   Ht 5\' 1"  (1.549 m)   Wt 45.4 kg (100 lb)   LMP 03/06/2017   SpO2 96%   BMI 18.89 kg/m   Physical Exam  Constitutional: She  is oriented to person, place, and time.  Scabs on face  HENT:  Head: Normocephalic.  Poor dentition and rotten teeth at #5, 6, 7, 8  Eyes: Conjunctivae are normal.  Neck: Neck supple.  Cardiovascular: Normal rate and regular rhythm.   Pulmonary/Chest: Effort normal and breath sounds normal.  Abdominal: Soft. Bowel sounds are normal.  Musculoskeletal: Normal range of motion.  Neurological: She is alert and oriented to person, place, and time.  Skin: Skin is warm and dry.  Psychiatric:  Flat affect  Nursing note and vitals reviewed.    ED Treatments / Results  Labs (all labs ordered are listed, but only abnormal results are displayed) Labs Reviewed - No data to display  EKG  EKG Interpretation None       Radiology No results found.  Procedures Procedures (including critical care time)  Medications Ordered in ED Medications  clindamycin (CLEOCIN) capsule 300 mg (300 mg Oral Given 03/20/17 0102)     Initial Impression / Assessment and Plan / ED Course  I have  reviewed the triage vital signs and the nursing notes.  Pertinent labs & imaging results that were available during my care of the patient were reviewed by me and considered in my medical decision making (see chart for details).     Patient has long standing poor dental health. Will Rx amoxicillin. Resources for dental care and mental health for drug addiction given.  Final Clinical Impressions(s) / ED Diagnoses   Final diagnoses:  Pain, dental    New Prescriptions New Prescriptions   AMOXICILLIN (AMOXIL) 500 MG CAPSULE    Take 1 capsule (500 mg total) by mouth 3 (three) times daily.     Donnetta Hutching, MD 03/20/17 [phone]

## 2017-03-20 NOTE — ED Triage Notes (Signed)
Pt comes in with dental pain starting yesterday. Pt has obvious dental caries and poor dental hygiene. No airway swelling noted. NAD noted.

## 2017-03-20 NOTE — Discharge Instructions (Signed)
Prescription for amoxicillin. Tylenol or ibuprofen for pain. Recommend follow-up with dentist and mental health system. RN will give you phone numbers. Consider Narcotics Anonymous for your drug problem.

## 2018-02-22 NOTE — Congregational Nurse Program (Signed)
Congregational Nurse Program Note  Date of Encounter: 02/22/2018  Past Medical History: Past Medical History:  Diagnosis Date  . Hepatitis C   . UTI (lower urinary tract infection)     Encounter Details: CNP Questionnaire - 02/22/18 1100      Questionnaire   Patient Status  Not Applicable    Race  White or Caucasian    [facility] Patient S[facility]  [employer] [place]    Insurance  Not Applicable    Uninsured  Uninsured (NEW 1x/quarter)    Food  No food insecurities    Housing/Utilities  Yes, have permanent housing    Transportation  No transportation needs    Interpersonal Safety  Yes, feel physically and emotionally safe where you currently live    Medication  No medication insecurities    Medical Provider  No    Referrals  [employer], [employer]    ED Visit Averted  Not Applicable    Life-Saving Intervention Made  Not Applicable     Seen at the food pantry. No medical insurance. [facility] [place] [address] [place] [facility] [phone]
